# Patient Record
Sex: Female | Born: 1960
Health system: Southern US, Community
[De-identification: ages and names within clinical notes are randomized; demographics above are authoritative.]

## PROBLEM LIST (undated history)

## (undated) DIAGNOSIS — M549 Dorsalgia, unspecified: Secondary | ICD-10-CM

## (undated) DIAGNOSIS — I10 Essential (primary) hypertension: Secondary | ICD-10-CM

## (undated) DIAGNOSIS — R0602 Shortness of breath: Secondary | ICD-10-CM

## (undated) DIAGNOSIS — R5383 Other fatigue: Secondary | ICD-10-CM

## (undated) DIAGNOSIS — R7303 Prediabetes: Secondary | ICD-10-CM

## (undated) DIAGNOSIS — M1990 Unspecified osteoarthritis, unspecified site: Secondary | ICD-10-CM

## (undated) DIAGNOSIS — H539 Unspecified visual disturbance: Secondary | ICD-10-CM

## (undated) DIAGNOSIS — E785 Hyperlipidemia, unspecified: Secondary | ICD-10-CM

## (undated) DIAGNOSIS — M2550 Pain in unspecified joint: Secondary | ICD-10-CM

## (undated) HISTORY — DX: Dorsalgia, unspecified: M54.9

## (undated) HISTORY — DX: Hyperlipidemia, unspecified: E78.5

## (undated) HISTORY — DX: Other fatigue: R53.83

## (undated) HISTORY — DX: Unspecified visual disturbance: H53.9

## (undated) HISTORY — DX: Pain in unspecified joint: M25.50

## (undated) HISTORY — DX: Unspecified osteoarthritis, unspecified site: M19.90

## (undated) HISTORY — DX: Shortness of breath: R06.02

## (undated) HISTORY — PX: MULTIPLE TOOTH EXTRACTIONS: SHX2053

---

## 2001-07-28 ENCOUNTER — Encounter: Payer: Self-pay | Admitting: Emergency Medicine

## 2001-07-28 ENCOUNTER — Emergency Department (HOSPITAL_COMMUNITY): Admission: EM | Admit: 2001-07-28 | Discharge: 2001-07-28 | Payer: Self-pay | Admitting: Emergency Medicine

## 2003-06-05 ENCOUNTER — Emergency Department (HOSPITAL_COMMUNITY): Admission: EM | Admit: 2003-06-05 | Discharge: 2003-06-06 | Payer: Self-pay | Admitting: Emergency Medicine

## 2005-11-21 ENCOUNTER — Emergency Department (HOSPITAL_COMMUNITY): Admission: EM | Admit: 2005-11-21 | Discharge: 2005-11-22 | Payer: Self-pay | Admitting: Emergency Medicine

## 2007-09-08 ENCOUNTER — Emergency Department (HOSPITAL_COMMUNITY): Admission: EM | Admit: 2007-09-08 | Discharge: 2007-09-08 | Payer: Self-pay | Admitting: Emergency Medicine

## 2009-06-13 ENCOUNTER — Emergency Department (HOSPITAL_COMMUNITY): Admission: EM | Admit: 2009-06-13 | Discharge: 2009-06-13 | Payer: Self-pay | Admitting: Emergency Medicine

## 2009-12-04 ENCOUNTER — Encounter: Admission: RE | Admit: 2009-12-04 | Discharge: 2009-12-04 | Payer: Self-pay | Admitting: Specialist

## 2010-02-08 ENCOUNTER — Encounter: Admission: RE | Admit: 2010-02-08 | Discharge: 2010-02-08 | Payer: Self-pay | Admitting: Orthopedic Surgery

## 2011-07-18 ENCOUNTER — Emergency Department (HOSPITAL_COMMUNITY)
Admission: EM | Admit: 2011-07-18 | Discharge: 2011-07-19 | Disposition: A | Discharge status: Home / Self Care | Payer: BC Managed Care – PPO | Attending: Emergency Medicine | Admitting: Emergency Medicine

## 2011-07-18 DIAGNOSIS — M25473 Effusion, unspecified ankle: Secondary | ICD-10-CM | POA: Insufficient documentation

## 2011-07-18 DIAGNOSIS — L03116 Cellulitis of left lower limb: Secondary | ICD-10-CM

## 2011-07-18 DIAGNOSIS — L03119 Cellulitis of unspecified part of limb: Secondary | ICD-10-CM | POA: Insufficient documentation

## 2011-07-18 DIAGNOSIS — Z7982 Long term (current) use of aspirin: Secondary | ICD-10-CM | POA: Insufficient documentation

## 2011-07-18 DIAGNOSIS — M79609 Pain in unspecified limb: Secondary | ICD-10-CM | POA: Insufficient documentation

## 2011-07-18 DIAGNOSIS — L02419 Cutaneous abscess of limb, unspecified: Secondary | ICD-10-CM | POA: Insufficient documentation

## 2011-07-18 DIAGNOSIS — M7989 Other specified soft tissue disorders: Secondary | ICD-10-CM | POA: Insufficient documentation

## 2011-07-18 DIAGNOSIS — Z299 Encounter for prophylactic measures, unspecified: Secondary | ICD-10-CM

## 2011-07-18 DIAGNOSIS — Z79899 Other long term (current) drug therapy: Secondary | ICD-10-CM | POA: Insufficient documentation

## 2011-07-18 DIAGNOSIS — M25476 Effusion, unspecified foot: Secondary | ICD-10-CM | POA: Insufficient documentation

## 2011-07-18 HISTORY — DX: Essential (primary) hypertension: I10

## 2011-07-18 LAB — CBC
HCT: 35.5 % — ABNORMAL LOW (ref 36.0–46.0)
Hemoglobin: 11.4 g/dL — ABNORMAL LOW (ref 12.0–15.0)
MCH: 27.3 pg (ref 26.0–34.0)
MCHC: 32.1 g/dL (ref 30.0–36.0)
MCV: 85.1 fL (ref 78.0–100.0)
Platelets: 288 10*3/uL (ref 150–400)
RBC: 4.17 MIL/uL (ref 3.87–5.11)
RDW: 13 % (ref 11.5–15.5)
WBC: 8.8 10*3/uL (ref 4.0–10.5)

## 2011-07-18 LAB — POCT I-STAT, CHEM 8
BUN: 10 mg/dL (ref 6–23)
Calcium, Ion: 1.23 mmol/L (ref 1.12–1.32)
Chloride: 101 mEq/L (ref 96–112)
Creatinine, Ser: 0.8 mg/dL (ref 0.50–1.10)
Glucose, Bld: 100 mg/dL — ABNORMAL HIGH (ref 70–99)
HCT: 37 % (ref 36.0–46.0)
Hemoglobin: 12.6 g/dL (ref 12.0–15.0)
Potassium: 3.3 mEq/L — ABNORMAL LOW (ref 3.5–5.1)
Sodium: 142 mEq/L (ref 135–145)
TCO2: 31 mmol/L (ref 0–100)

## 2011-07-18 LAB — DIFFERENTIAL
Basophils Absolute: 0 10*3/uL (ref 0.0–0.1)
Basophils Relative: 0 % (ref 0–1)
Eosinophils Absolute: 0.2 10*3/uL (ref 0.0–0.7)
Eosinophils Relative: 2 % (ref 0–5)
Lymphocytes Relative: 27 % (ref 12–46)
Lymphs Abs: 2.4 10*3/uL (ref 0.7–4.0)
Monocytes Absolute: 0.7 10*3/uL (ref 0.1–1.0)
Monocytes Relative: 8 % (ref 3–12)
Neutro Abs: 5.5 10*3/uL (ref 1.7–7.7)
Neutrophils Relative %: 63 % (ref 43–77)

## 2011-07-18 LAB — D-DIMER, QUANTITATIVE: D-Dimer, Quant: 0.49 ug/mL-FEU — ABNORMAL HIGH (ref 0.00–0.48)

## 2011-07-18 NOTE — ED Notes (Signed)
Redness and warmth to LLE, palpable pulses

## 2011-07-18 NOTE — ED Notes (Signed)
Pt c/o LLE swelling x1 wk, pcp sent pt to ed to r/o DVT, no hx of DVT

## 2011-07-18 NOTE — ED Provider Notes (Signed)
History     CSN: [id number]  Arrival date & time 07/18/11  1825   First MD Initiated Contact with Patient 07/18/11 2153      No chief complaint on file.   (Consider location/radiation/quality/duration/timing/severity/associated sxs/prior treatment) HPI Comments: Ms. Bischoff is morbidly obese, African American female with one week of left lower mid shin anterior aspect pain and redness.  Moderate amount of swelling, which extends into the ankle [facility] and sent to the emergency room for further evaluation with the concern being DVT.  Patient does not have a history of DVT.  Other than obesity, has no breast factors to have a DVT  The history is provided by the patient.    Past Medical History  Diagnosis Date  . Hypertension     History reviewed. No pertinent past surgical history.  History reviewed. No pertinent family history.  History  Substance Use Topics  . Smoking status: Never Smoker   . Smokeless tobacco: Not on file  . Alcohol Use: No    OB History    Grav Para Term Preterm Abortions TAB SAB Ect Mult Living                  Review of Systems  Constitutional: Negative for fever, chills and activity change.  Cardiovascular: Positive for leg swelling.  Musculoskeletal: Negative for joint swelling and arthralgias.  Neurological: Negative for weakness.  Hematological: Negative.   Psychiatric/Behavioral: Negative.     Allergies  Review of patient's allergies indicates no known allergies.  Home Medications   Current Outpatient Rx  Name Route Sig Dispense Refill  . ASPIRIN 81 MG PO CHEW Oral Chew 81 mg by mouth daily.      Marland Kitchen LISINOPRIL-HYDROCHLOROTHIAZIDE 10-12.5 MG PO TABS Oral Take 1 tablet by mouth daily.      . CEPHALEXIN 250 MG PO CAPS Oral Take 1 capsule (250 mg total) by mouth 3 (three) times daily. 30 capsule 0    BP 123/65  Pulse 82  Temp(Src) 98.3 F (36.8 C) (Oral)  Resp 18  Ht 5\' 2"  (1.575 m)  Wt 308 lb (139.708 kg)  BMI  56.33 kg/m2  SpO2 98%  Physical Exam  Constitutional: She is oriented to person, place, and time. She appears well-developed and well-nourished.  HENT:  Head: Normocephalic.  Eyes: Pupils are equal, round, and reactive to light.  Neck: Normal range of motion.  Cardiovascular: Normal rate.   Pulmonary/Chest: Effort normal.  Musculoskeletal: She exhibits edema and tenderness.       Left ankle: She exhibits swelling. She exhibits no ecchymosis, no deformity, no laceration and normal pulse. tenderness.  Neurological: She is alert and oriented to person, place, and time.  Skin: Skin is intact. No bruising, no ecchymosis and no rash noted. There is erythema.     Psychiatric: She has a normal mood and affect.    ED Course  Procedures (including critical care time)  Labs Reviewed  CBC - Abnormal; Notable for the following:    Hemoglobin 11.4 (*)    HCT 35.5 (*)    All other components within normal limits  D-DIMER, QUANTITATIVE - Abnormal; Notable for the following:    D-Dimer, Quant 0.49 (*)    All other components within normal limits  POCT I-STAT, CHEM 8 - Abnormal; Notable for the following:    Potassium 3.3 (*)    Glucose, Bld 100 (*)    All other components within normal limits  DIFFERENTIAL  I-STAT, CHEM 8  No results found.   1. Cellulitis of left leg without foot   2. DVT prophylaxis       MDM  Facial cellulitis versus DVT.  We'll obtain labs including CBC, electrolytes, and d-dimer        Arman Filter, NP 07/18/11 2245  Arman Filter, NP 07/19/11 [phone]

## 2011-07-18 NOTE — ED Notes (Deleted)
Pt states her doctors office called while pt was waiting and said to just return in the morning.

## 2011-07-19 ENCOUNTER — Ambulatory Visit (HOSPITAL_COMMUNITY)
Admission: RE | Admit: 2011-07-19 | Discharge: 2011-07-19 | Disposition: A | Discharge status: Home / Self Care | Payer: BC Managed Care – PPO | Source: Ambulatory Visit | Attending: Emergency Medicine | Admitting: Emergency Medicine

## 2011-07-19 DIAGNOSIS — L0390 Cellulitis, unspecified: Secondary | ICD-10-CM

## 2011-07-19 DIAGNOSIS — L0291 Cutaneous abscess, unspecified: Secondary | ICD-10-CM | POA: Insufficient documentation

## 2011-07-19 DIAGNOSIS — M79609 Pain in unspecified limb: Secondary | ICD-10-CM

## 2011-07-19 MED ORDER — CEPHALEXIN 250 MG PO CAPS
250.0000 mg | ORAL_CAPSULE | Freq: Once | ORAL | Status: AC
  Administered 2011-07-19: 250 mg via ORAL
  Filled 2011-07-19: qty 1

## 2011-07-19 MED ORDER — ENOXAPARIN SODIUM 150 MG/ML ~~LOC~~ SOLN
1.0000 mg/kg | Freq: Once | SUBCUTANEOUS | Status: AC
  Administered 2011-07-19: 140 mg via SUBCUTANEOUS
  Filled 2011-07-19: qty 1

## 2011-07-19 MED ORDER — CEPHALEXIN 250 MG PO CAPS: 250.0000 mg | ORAL_CAPSULE | Freq: Three times a day (TID) | ORAL | Status: AC

## 2011-07-19 NOTE — ED Provider Notes (Signed)
Medical screening examination/treatment/procedure(s) were performed by non-physician practitioner and as supervising physician I was immediately available for consultation/collaboration.  Flint Melter, MD 07/19/11 [phone]

## 2011-07-19 NOTE — Progress Notes (Signed)
*  PRELIMINARY RESULTS*  Left Lower Extremity Venous Duplex has been performed. Technically difficult due to patient body habitus. Left:  No obvious evidence of DVT or Baker's cyst.   Farrel Demark RDMS 07/19/2011, 9:32 AM

## 2012-09-09 DIAGNOSIS — I10 Essential (primary) hypertension: Secondary | ICD-10-CM | POA: Insufficient documentation

## 2013-01-16 ENCOUNTER — Other Ambulatory Visit: Payer: Self-pay | Admitting: Family Medicine

## 2013-01-16 DIAGNOSIS — R109 Unspecified abdominal pain: Secondary | ICD-10-CM

## 2013-01-17 ENCOUNTER — Other Ambulatory Visit: Payer: BC Managed Care – PPO

## 2013-01-17 ENCOUNTER — Other Ambulatory Visit: Payer: Self-pay | Admitting: Family Medicine

## 2013-01-17 DIAGNOSIS — R109 Unspecified abdominal pain: Secondary | ICD-10-CM

## 2013-01-21 ENCOUNTER — Ambulatory Visit
Admission: RE | Admit: 2013-01-21 | Discharge: 2013-01-21 | Disposition: A | Discharge status: Home / Self Care | Payer: BC Managed Care – PPO | Source: Ambulatory Visit | Attending: Family Medicine | Admitting: Family Medicine

## 2013-01-21 DIAGNOSIS — R109 Unspecified abdominal pain: Secondary | ICD-10-CM

## 2013-01-30 ENCOUNTER — Other Ambulatory Visit: Payer: Self-pay | Admitting: Family Medicine

## 2013-01-30 DIAGNOSIS — K869 Disease of pancreas, unspecified: Secondary | ICD-10-CM

## 2013-02-06 ENCOUNTER — Ambulatory Visit
Admission: RE | Admit: 2013-02-06 | Discharge: 2013-02-06 | Disposition: A | Discharge status: Home / Self Care | Payer: BC Managed Care – PPO | Source: Ambulatory Visit | Attending: Family Medicine | Admitting: Family Medicine

## 2013-02-06 DIAGNOSIS — K869 Disease of pancreas, unspecified: Secondary | ICD-10-CM

## 2013-02-10 ENCOUNTER — Other Ambulatory Visit: Payer: BC Managed Care – PPO

## 2013-02-11 ENCOUNTER — Other Ambulatory Visit: Payer: Self-pay | Admitting: Obstetrics and Gynecology

## 2013-02-11 ENCOUNTER — Other Ambulatory Visit (HOSPITAL_COMMUNITY)
Admission: RE | Admit: 2013-02-11 | Discharge: 2013-02-11 | Disposition: A | Discharge status: Home / Self Care | Payer: BC Managed Care – PPO | Source: Ambulatory Visit | Attending: Obstetrics and Gynecology | Admitting: Obstetrics and Gynecology

## 2013-02-11 DIAGNOSIS — Z1151 Encounter for screening for human papillomavirus (HPV): Secondary | ICD-10-CM | POA: Insufficient documentation

## 2013-02-11 DIAGNOSIS — Z01419 Encounter for gynecological examination (general) (routine) without abnormal findings: Secondary | ICD-10-CM | POA: Insufficient documentation

## 2013-07-23 ENCOUNTER — Other Ambulatory Visit: Payer: Self-pay | Admitting: Family Medicine

## 2013-07-23 DIAGNOSIS — K862 Cyst of pancreas: Secondary | ICD-10-CM

## 2013-07-31 ENCOUNTER — Ambulatory Visit
Admission: RE | Admit: 2013-07-31 | Discharge: 2013-07-31 | Disposition: A | Discharge status: Home / Self Care | Payer: BC Managed Care – PPO | Source: Ambulatory Visit | Attending: Family Medicine | Admitting: Family Medicine

## 2013-07-31 DIAGNOSIS — K862 Cyst of pancreas: Secondary | ICD-10-CM

## 2014-03-23 IMAGING — US US ABDOMEN COMPLETE
1 series · 13 of 25 positions shown · non-contrast
Comparison: Ultrasound 01/21/2013

CLINICAL DATA: Followup pancreatic lesion.

EXAM:
ULTRASOUND ABDOMEN COMPLETE

[Series 1: us abdomen complete · 0.26mm/px · 13 of 95 slices shown]
[im 1/95]
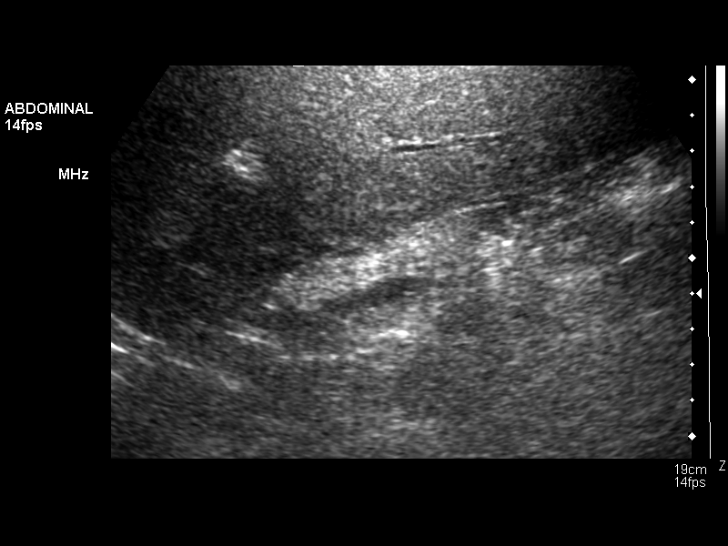
[im 8/95]
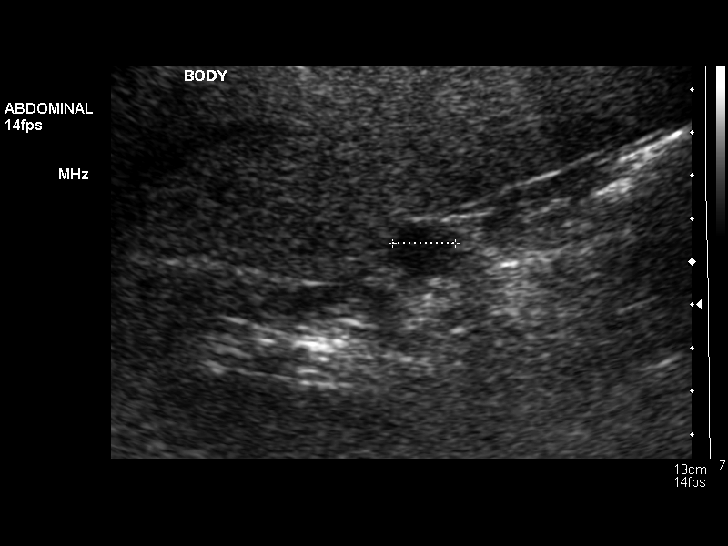
[im 16/95]
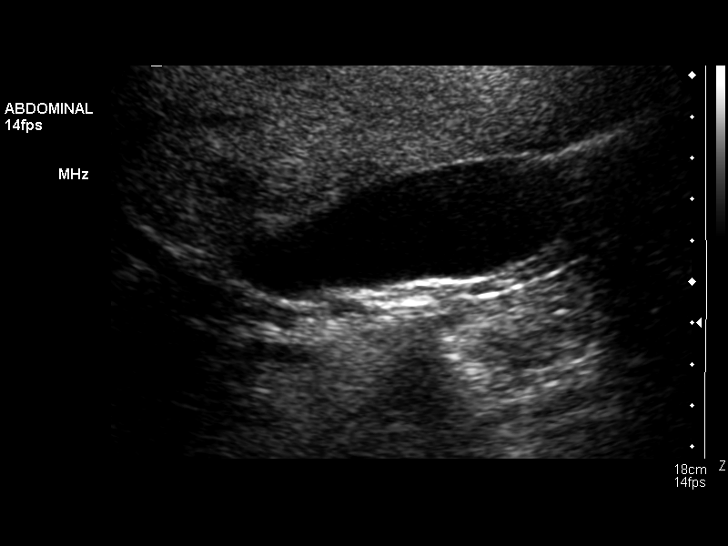
[im 24/95]
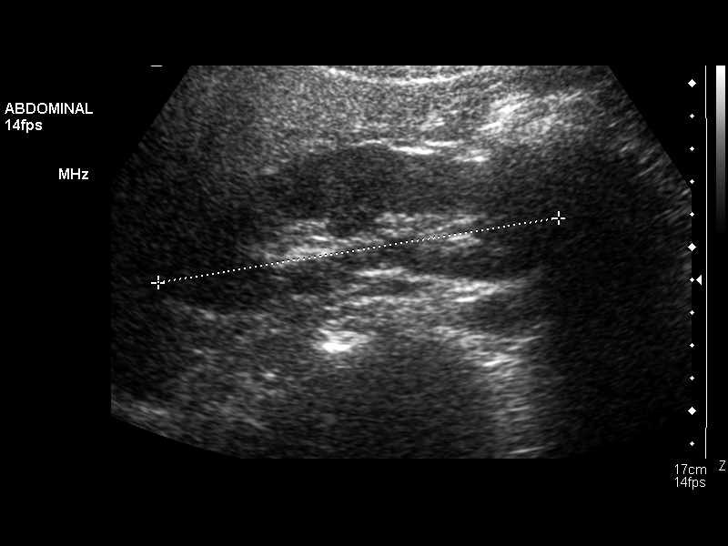
[im 32/95]
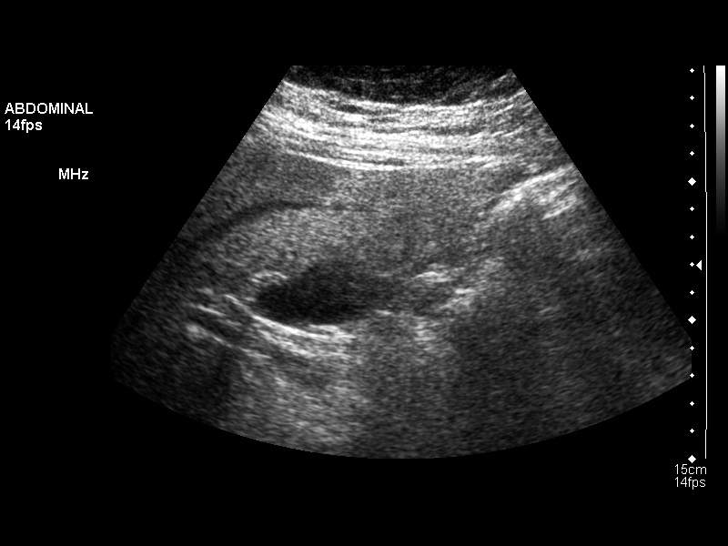
[im 40/95]
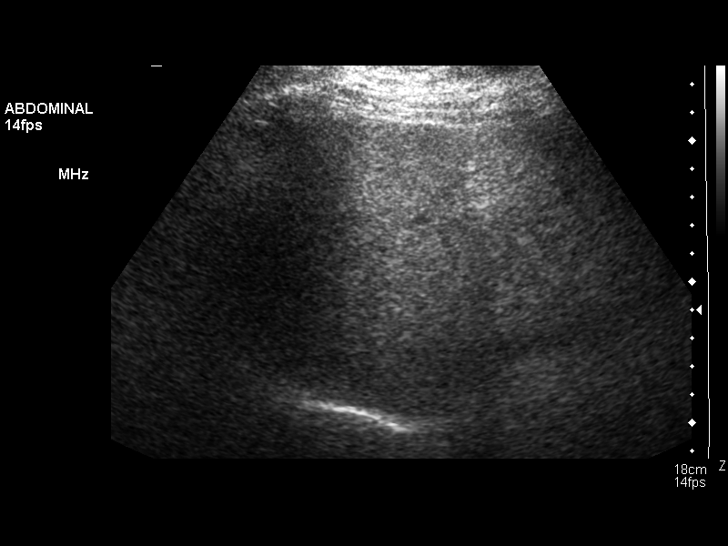
[im 48/95]
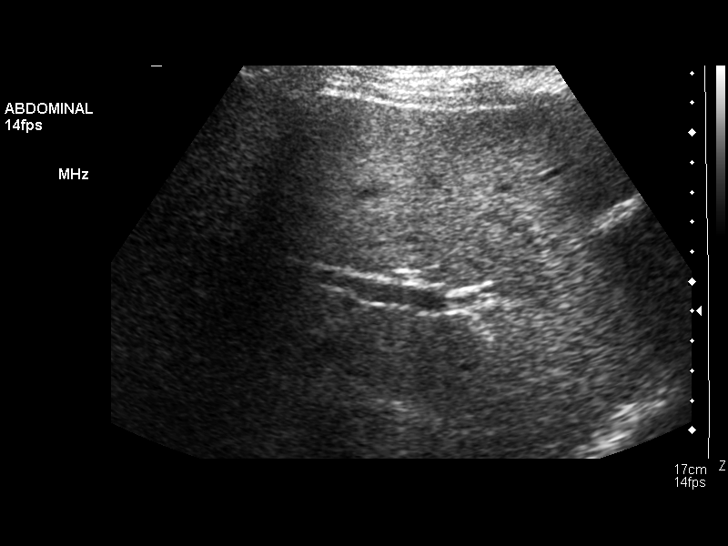
[im 55/95]
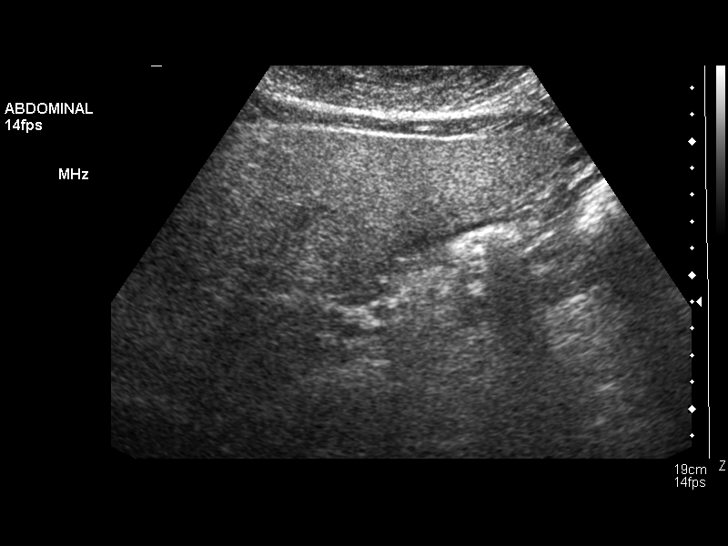
[im 63/95]
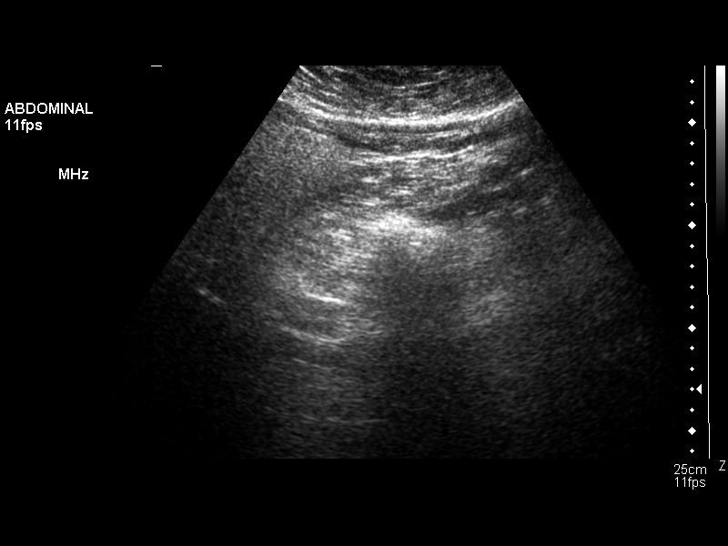
[im 71/95]
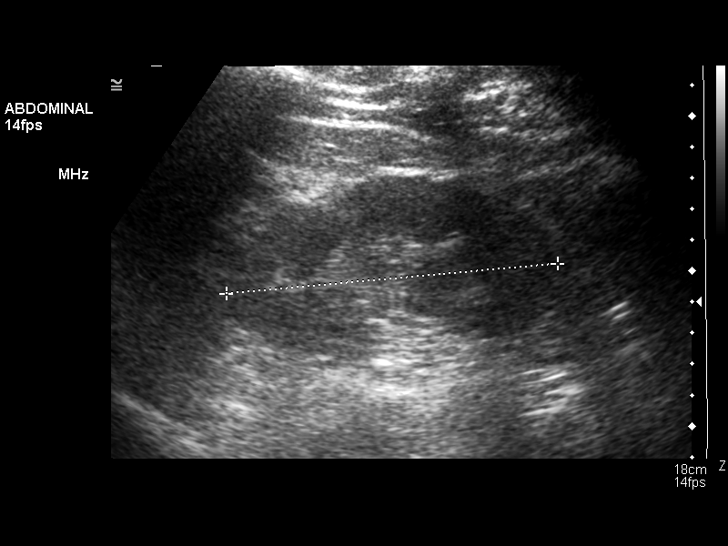
[im 79/95]
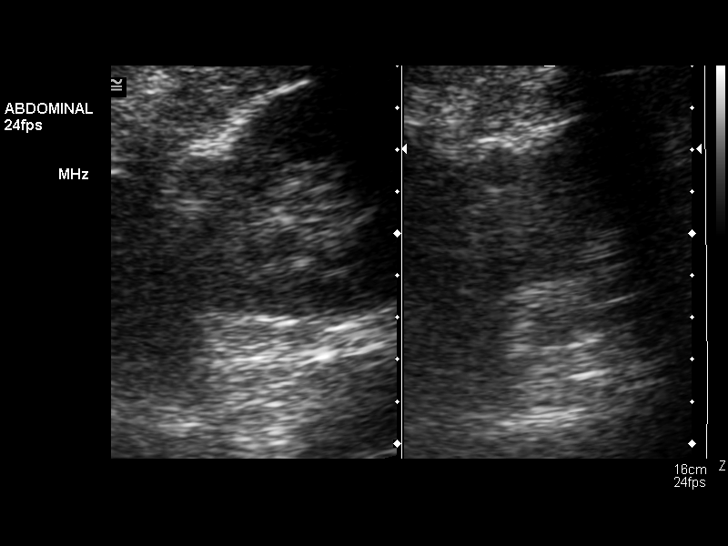
[im 87/95]
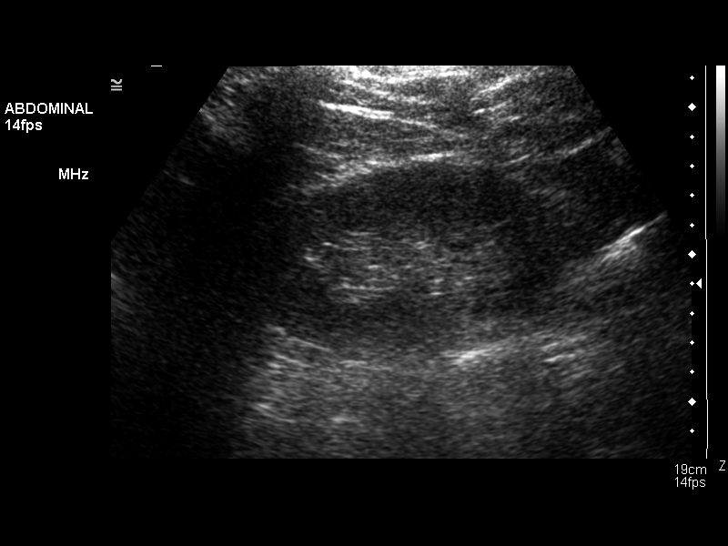
[im 95/95]
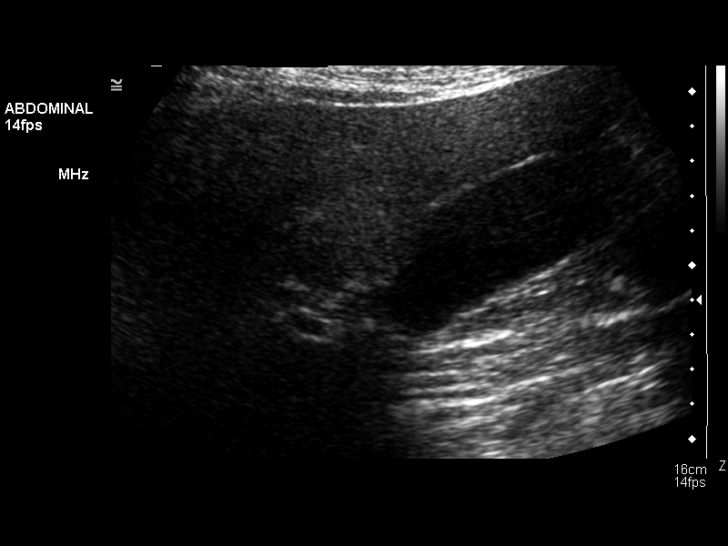

[13 of 25 positions shown; findings below may reference images not displayed]

FINDINGS: Gallbladder:

No gallstones or wall thickening visualized. No sonographic Murphy
sign noted.

Common bile duct:

Diameter: 6.4 mm

Liver:

Echogenic liver consistent with fatty infiltration. No focal liver
lesion.

IVC:

Not visualized

Pancreas:

Hypoechoic lesion in the pancreatic body measuring 18 x 13 x 15 mm.
No internal blood flow on Doppler evaluation. This lesion is
unchanged from the prior ultrasound.

Spleen:

Size and appearance within normal limits.

Right Kidney:

Length: 12.4 cm. Echogenicity within normal limits. No mass or
hydronephrosis visualized.

Left Kidney:

Length: 11.2 cm. 2.0 cm lower pole cyst and 2.3 cm midpole cyst..
Echogenicity within normal limits. Negative for hydronephrosis. Left
upper pole solid-appearing nodule measuring 11 mm without blood flow
by Doppler. Followup of this lesion is suggested. This is not seen
on prior studies. .

Abdominal aorta:

No aneurysm visualized.

Other findings:

Exam quality limited due to body habitus.
IMPRESSION: Hypoechoic lesion pancreatic body measuring 18 x 13 x 15 mm is
stable. This could be an inflammatory or neoplastic cyst.

11 mm left upper pole nodule. Left renal cyst. Follow-up of these
lesions suggested in 1 year.

## 2015-06-15 ENCOUNTER — Other Ambulatory Visit: Payer: Self-pay | Admitting: Family Medicine

## 2015-06-15 DIAGNOSIS — K862 Cyst of pancreas: Secondary | ICD-10-CM

## 2015-08-18 ENCOUNTER — Other Ambulatory Visit: Payer: Self-pay

## 2015-10-22 DIAGNOSIS — E669 Obesity, unspecified: Secondary | ICD-10-CM | POA: Diagnosis not present

## 2015-10-22 DIAGNOSIS — Z6841 Body Mass Index (BMI) 40.0 and over, adult: Secondary | ICD-10-CM | POA: Diagnosis not present

## 2015-10-22 DIAGNOSIS — M25561 Pain in right knee: Secondary | ICD-10-CM | POA: Diagnosis not present

## 2015-10-22 DIAGNOSIS — Z13220 Encounter for screening for lipoid disorders: Secondary | ICD-10-CM | POA: Diagnosis not present

## 2015-10-22 DIAGNOSIS — I10 Essential (primary) hypertension: Secondary | ICD-10-CM | POA: Diagnosis not present

## 2015-10-22 DIAGNOSIS — M25562 Pain in left knee: Secondary | ICD-10-CM | POA: Diagnosis not present

## 2016-04-28 DIAGNOSIS — I10 Essential (primary) hypertension: Secondary | ICD-10-CM | POA: Diagnosis not present

## 2016-04-28 DIAGNOSIS — E669 Obesity, unspecified: Secondary | ICD-10-CM | POA: Diagnosis not present

## 2016-04-28 DIAGNOSIS — M25561 Pain in right knee: Secondary | ICD-10-CM | POA: Diagnosis not present

## 2016-11-23 DIAGNOSIS — I10 Essential (primary) hypertension: Secondary | ICD-10-CM | POA: Diagnosis not present

## 2016-11-23 DIAGNOSIS — Z6841 Body Mass Index (BMI) 40.0 and over, adult: Secondary | ICD-10-CM | POA: Diagnosis not present

## 2017-03-15 DIAGNOSIS — R079 Chest pain, unspecified: Secondary | ICD-10-CM | POA: Diagnosis not present

## 2017-03-15 DIAGNOSIS — Z1159 Encounter for screening for other viral diseases: Secondary | ICD-10-CM | POA: Diagnosis not present

## 2017-03-15 DIAGNOSIS — E119 Type 2 diabetes mellitus without complications: Secondary | ICD-10-CM | POA: Diagnosis not present

## 2017-03-15 DIAGNOSIS — Z1389 Encounter for screening for other disorder: Secondary | ICD-10-CM | POA: Diagnosis not present

## 2017-03-15 DIAGNOSIS — Z6841 Body Mass Index (BMI) 40.0 and over, adult: Secondary | ICD-10-CM | POA: Diagnosis not present

## 2017-03-15 DIAGNOSIS — I10 Essential (primary) hypertension: Secondary | ICD-10-CM | POA: Diagnosis not present

## 2017-03-15 DIAGNOSIS — Z0001 Encounter for general adult medical examination with abnormal findings: Secondary | ICD-10-CM | POA: Diagnosis not present

## 2017-03-29 DIAGNOSIS — I10 Essential (primary) hypertension: Secondary | ICD-10-CM | POA: Diagnosis not present

## 2017-03-29 DIAGNOSIS — Z8249 Family history of ischemic heart disease and other diseases of the circulatory system: Secondary | ICD-10-CM | POA: Diagnosis not present

## 2017-03-29 DIAGNOSIS — R079 Chest pain, unspecified: Secondary | ICD-10-CM | POA: Diagnosis not present

## 2017-09-08 ENCOUNTER — Other Ambulatory Visit: Payer: Self-pay | Admitting: Obstetrics and Gynecology

## 2017-09-08 ENCOUNTER — Other Ambulatory Visit (HOSPITAL_COMMUNITY)
Admission: RE | Admit: 2017-09-08 | Discharge: 2017-09-08 | Disposition: A | Discharge status: Home / Self Care | Payer: Medicare Other | Source: Ambulatory Visit | Attending: Obstetrics and Gynecology | Admitting: Obstetrics and Gynecology

## 2017-09-08 DIAGNOSIS — Z124 Encounter for screening for malignant neoplasm of cervix: Secondary | ICD-10-CM | POA: Diagnosis not present

## 2017-09-08 DIAGNOSIS — N950 Postmenopausal bleeding: Secondary | ICD-10-CM | POA: Diagnosis not present

## 2017-09-11 ENCOUNTER — Other Ambulatory Visit: Payer: Self-pay | Admitting: Nurse Practitioner

## 2017-09-11 DIAGNOSIS — N950 Postmenopausal bleeding: Secondary | ICD-10-CM

## 2017-09-13 DIAGNOSIS — E119 Type 2 diabetes mellitus without complications: Secondary | ICD-10-CM | POA: Diagnosis not present

## 2017-09-13 DIAGNOSIS — Z6841 Body Mass Index (BMI) 40.0 and over, adult: Secondary | ICD-10-CM | POA: Diagnosis not present

## 2017-09-13 DIAGNOSIS — E669 Obesity, unspecified: Secondary | ICD-10-CM | POA: Diagnosis not present

## 2017-09-13 DIAGNOSIS — I10 Essential (primary) hypertension: Secondary | ICD-10-CM | POA: Diagnosis not present

## 2017-09-13 LAB — CYTOLOGY - PAP
Diagnosis: NEGATIVE
HPV: NOT DETECTED

## 2017-09-14 ENCOUNTER — Ambulatory Visit
Admission: RE | Admit: 2017-09-14 | Discharge: 2017-09-14 | Disposition: A | Discharge status: Home / Self Care | Payer: Medicare Other | Source: Ambulatory Visit | Attending: Nurse Practitioner | Admitting: Nurse Practitioner

## 2017-09-14 DIAGNOSIS — N939 Abnormal uterine and vaginal bleeding, unspecified: Secondary | ICD-10-CM | POA: Diagnosis not present

## 2017-09-14 DIAGNOSIS — N950 Postmenopausal bleeding: Secondary | ICD-10-CM

## 2017-09-19 ENCOUNTER — Other Ambulatory Visit: Payer: Self-pay | Admitting: Obstetrics and Gynecology

## 2017-09-19 DIAGNOSIS — N8500 Endometrial hyperplasia, unspecified: Secondary | ICD-10-CM | POA: Diagnosis not present

## 2017-09-19 DIAGNOSIS — N950 Postmenopausal bleeding: Secondary | ICD-10-CM | POA: Diagnosis not present

## 2017-09-27 ENCOUNTER — Encounter (HOSPITAL_COMMUNITY): Payer: Self-pay | Admitting: *Deleted

## 2017-09-27 ENCOUNTER — Other Ambulatory Visit: Payer: Self-pay

## 2017-09-27 NOTE — Progress Notes (Signed)
Surgery 10/02/17.  Late add-on.  No pre-op appointment available. Patient made LSD for DOS.  Patient to arrive at 0945 on 10/02/17 to make sure everything is complete and ready for 12 noon surgery.

## 2017-09-28 DIAGNOSIS — R9389 Abnormal findings on diagnostic imaging of other specified body structures: Secondary | ICD-10-CM | POA: Diagnosis not present

## 2017-09-28 DIAGNOSIS — N950 Postmenopausal bleeding: Secondary | ICD-10-CM | POA: Diagnosis not present

## 2017-09-28 DIAGNOSIS — Z6841 Body Mass Index (BMI) 40.0 and over, adult: Secondary | ICD-10-CM | POA: Diagnosis not present

## 2017-10-01 ENCOUNTER — Other Ambulatory Visit: Payer: Self-pay | Admitting: Obstetrics and Gynecology

## 2017-10-01 NOTE — H&P (Deleted)
  The note originally documented on this encounter has been moved the the encounter in which it belongs.  

## 2017-10-01 NOTE — H&P (Signed)
Chief Complaint(s):   postmenopausal bleeding.   HPI:  General 57 y/o presents for preop visit in preparation for hysteroscopy D&C possible polypecotmy with myosure. to evaluate postmenopausal bleeding. she reports spotting the christmas of 2017. she had spotting again the christmas of 2018. she began bleeding heavily a week ago. she change an overnight pad every 2 hours on her heaviest day. she was started on megace at her first visit 09/08/2017. Her bleeding has decreased.  she had an ultrasound 09/14/2017 that revealed a 12 cm x 5 cm x 8 cm uterus. the endometrium was thickened to 33 mm.  she and endometrial biopsy 09/19/2017 that showed benign secretory endometrium.  she reports menopause starting in her late 69's. Current Medication: Taking  Aspirin 325 MG Tablet 1 tablet Orally Once a day as needed     Fish Oil 1000 MG Capsule 1 capsule Orally Once a day     Vitamin D 1000 UNIT Tablet 1 tablet Orally Once a day     Megestrol Acetate 40 MG Tablet 1 tablet Orally Twice a day     Zestoretic(Lisinopril-Hydrochlorothiazide) 20-25 MG Tablet 1 tablet Orally Once a day     Medication List reviewed and reconciled with the patient   Medical History:  hypertension     tendenitis of knees Dr. Darrelyn Hillock     obesity     OA in bilateral knees & lower back      Allergies/Intolerance:  Latex Gloves - rash   Gyn History:  Sexual activity currently sexually active. Periods : postmenopausal. LMP 40's . Last pap smear date 09/08/17-NEG. Last mammogram date Never. Denies Abnormal pap smear. Denies STD. Menarche 13.   OB History:  Never been pregnant per patient.   Surgical History:  No Surgical History documented.   Hospitalization:  fluid on the knee 1979   Family History:  Father: deceased 29 yrs, massive MI, diagnosed with Coronary artery disease    Mother: deceased 46 yrs, brain blood clot post falling, hx of kidney failure, Diabetes, Hypertension    Paternal Grand Father: deceased, no  known hx    Paternal Grand Mother: deceased, no known hx    Maternal Grand Father: deceased, died in sleep    Maternal Grand Mother: deceased, old age, died in sleep    Brother 1: deceased 34 yrs, massive MI 09-06-18Hypertension, Coronary artery disease    Brother2: alive, healthy    Brother 3: alive, healthy    Sister 1: alive, HTN, Hypertension    Sister 2: alive, healthy    Sister 3: alive, HTN, Hypertension    3 brother(s) , 3 sister(s) - healthy.    no children.  Social History: General Tobacco use cigarettes: Never smoked, Tobacco history last updated 09/28/2017.  EXPOSURE TO PASSIVE SMOKE: no passive smoking.  no Alcohol, no.  Caffeine: yes, occasional tea.  no Recreational drug use.  DIET: regular.  Exercise: limited by knees.  DENTAL CARE: dentures.  Marital Status: married, Married, 1982, [place].  Children: none.  EDUCATION: High School.  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  COMMUNICATION BARRIERS: no hearing, vision or cognition issues.  Religion: [employer] [facility] [employer].  Seat belt use: always.  Tobacco Exposure: None.   ROS:  Negative except as stated in HPI.  Objective: Vitals: Wt 337, Wt change -3 lb, Ht 63, BMI 59.69, Pulse sitting 93, BP sitting 128/78  Past Results: Examination:  General Examination alert, oriented, NAD " label="GENERAL APPEARANCE" categoryPropId="[id number]" examid="[id number]"GENERAL APPEARANCE alert, oriented, NAD .  moist, warm" label="SKIN:" categoryPropId="10109" examid="[id number]"SKIN: moist, warm.  Conjunctiva clear" label="EYES:" categoryPropId="21468" examid="[id number]"EYES: Conjunctiva clear.  clear to auscultation bilaterally" label="LUNGS:" categoryPropId="87" examid="[id number]"LUNGS: clear to auscultation bilaterally.  regular rate and rhythm" label="HEART:" categoryPropId="86" examid="[id number]"HEART: regular rate and rhythm.  soft, non-tender/non-distended, bowel sounds present " label="ABDOMEN:"  categoryPropId="88" examid="[id number]"ABDOMEN: soft, non-tender/non-distended, bowel sounds present .  normal external genitalia, labia - unremarkable, vagina - pink moist mucosa, no lesions or abnormal discharge, cervix - no discharge or lesions or CMT, adnexa - no masses or tenderness, uterus - nontender and normal size on palpation " label="FEMALE GENITOURINARY:" categoryPropId="13414" examid="[id number]"FEMALE GENITOURINARY: normal external genitalia, labia - unremarkable, vagina - pink moist mucosa, no lesions or abnormal discharge, cervix - no discharge or lesions or CMT, adnexa - no masses or tenderness, uterus - nontender and normal size on palpation .  affect normal, good eye contact" label="PSYCH:" categoryPropId="16316" examid="[id number]"PSYCH: affect normal, good eye contact.  Physical Examination:    Assessment: Assessment:  Postmenopausal bleeding - N95.0 (Primary)     Endometrial thickening on ultra sound - 793.5     Morbid obesity - E66.01     Plan: Treatment: Postmenopausal bleeding Notes: given patient morbid obesity and severely thickened endometrium recommend hysteroscopy D&C possible polypecotmy with myosures even though her endometrial biopsy was benign. she is at very high risk for underlying hyperplasia or endometrial carcinoms.. r/b/a of hysteroscopy D&C and polypectomy with myosure were reviewed with the patient including but not limited to infection/ bleeding / perforation of the uterus/ damage to surrounding organs with the need for further surgery. she excepts these risk and desires to proceed.

## 2017-10-02 ENCOUNTER — Ambulatory Visit (HOSPITAL_COMMUNITY)
Admission: RE | Admit: 2017-10-02 | Discharge: 2017-10-02 | Disposition: A | Discharge status: Home / Self Care | Payer: Medicare Other | Source: Ambulatory Visit | Attending: Obstetrics and Gynecology | Admitting: Obstetrics and Gynecology

## 2017-10-02 ENCOUNTER — Ambulatory Visit (HOSPITAL_COMMUNITY): Payer: Medicare Other | Admitting: Anesthesiology

## 2017-10-02 ENCOUNTER — Other Ambulatory Visit: Payer: Self-pay

## 2017-10-02 ENCOUNTER — Encounter (HOSPITAL_COMMUNITY)
Admission: RE | Disposition: A | Discharge status: Home / Self Care | Payer: Self-pay | Source: Ambulatory Visit | Attending: Obstetrics and Gynecology

## 2017-10-02 ENCOUNTER — Encounter (HOSPITAL_COMMUNITY): Payer: Self-pay | Admitting: Anesthesiology

## 2017-10-02 DIAGNOSIS — N840 Polyp of corpus uteri: Secondary | ICD-10-CM | POA: Diagnosis not present

## 2017-10-02 DIAGNOSIS — M479 Spondylosis, unspecified: Secondary | ICD-10-CM | POA: Diagnosis not present

## 2017-10-02 DIAGNOSIS — Z79890 Hormone replacement therapy (postmenopausal): Secondary | ICD-10-CM | POA: Diagnosis not present

## 2017-10-02 DIAGNOSIS — Z91040 Latex allergy status: Secondary | ICD-10-CM | POA: Insufficient documentation

## 2017-10-02 DIAGNOSIS — Z6841 Body Mass Index (BMI) 40.0 and over, adult: Secondary | ICD-10-CM | POA: Insufficient documentation

## 2017-10-02 DIAGNOSIS — Z7982 Long term (current) use of aspirin: Secondary | ICD-10-CM | POA: Diagnosis not present

## 2017-10-02 DIAGNOSIS — Z79899 Other long term (current) drug therapy: Secondary | ICD-10-CM | POA: Diagnosis not present

## 2017-10-02 DIAGNOSIS — M170 Bilateral primary osteoarthritis of knee: Secondary | ICD-10-CM | POA: Diagnosis not present

## 2017-10-02 DIAGNOSIS — I10 Essential (primary) hypertension: Secondary | ICD-10-CM | POA: Insufficient documentation

## 2017-10-02 DIAGNOSIS — N950 Postmenopausal bleeding: Secondary | ICD-10-CM | POA: Insufficient documentation

## 2017-10-02 HISTORY — PX: DILATATION & CURETTAGE/HYSTEROSCOPY WITH MYOSURE: SHX6511

## 2017-10-02 HISTORY — DX: Prediabetes: R73.03

## 2017-10-02 HISTORY — DX: Unspecified osteoarthritis, unspecified site: M19.90

## 2017-10-02 LAB — CBC
HCT: 34.8 % — ABNORMAL LOW (ref 36.0–46.0)
Hemoglobin: 11.6 g/dL — ABNORMAL LOW (ref 12.0–15.0)
MCH: 28.4 pg (ref 26.0–34.0)
MCHC: 33.3 g/dL (ref 30.0–36.0)
MCV: 85.1 fL (ref 78.0–100.0)
Platelets: 315 10*3/uL (ref 150–400)
RBC: 4.09 MIL/uL (ref 3.87–5.11)
RDW: 14.3 % (ref 11.5–15.5)
WBC: 9.8 10*3/uL (ref 4.0–10.5)

## 2017-10-02 LAB — BASIC METABOLIC PANEL
Anion gap: 9 (ref 5–15)
BUN: 18 mg/dL (ref 6–20)
CO2: 20 mmol/L — ABNORMAL LOW (ref 22–32)
Calcium: 9.5 mg/dL (ref 8.9–10.3)
Chloride: 106 mmol/L (ref 101–111)
Creatinine, Ser: 1.11 mg/dL — ABNORMAL HIGH (ref 0.44–1.00)
GFR calc Af Amer: >60 mL/min (ref >60)
GFR calc non Af Amer: 54 mL/min — ABNORMAL LOW (ref >60)
Glucose, Bld: 118 mg/dL — ABNORMAL HIGH (ref 65–99)
Potassium: 4.2 mmol/L (ref 3.5–5.1)
Sodium: 135 mmol/L (ref 135–145)

## 2017-10-02 LAB — GLUCOSE, CAPILLARY: Glucose-Capillary: 106 mg/dL — ABNORMAL HIGH (ref 65–99)

## 2017-10-02 SURGERY — DILATATION & CURETTAGE/HYSTEROSCOPY WITH MYOSURE
Anesthesia: General | Site: Vagina

## 2017-10-02 MED ORDER — BUPIVACAINE HCL (PF) 0.25 % IJ SOLN
INTRAMUSCULAR | Status: DC | PRN
  Administered 2017-10-02: 20 mL

## 2017-10-02 MED ORDER — FENTANYL CITRATE (PF) 100 MCG/2ML IJ SOLN: 25.0000 ug | INTRAMUSCULAR | Status: DC | PRN

## 2017-10-02 MED ORDER — ONDANSETRON HCL 4 MG/2ML IJ SOLN
INTRAMUSCULAR | Status: AC
  Filled 2017-10-02: qty 2

## 2017-10-02 MED ORDER — PROPOFOL 10 MG/ML IV BOLUS
INTRAVENOUS | Status: DC | PRN
  Administered 2017-10-02: 200 mg via INTRAVENOUS
  Administered 2017-10-02 (×2): 50 mg via INTRAVENOUS
  Administered 2017-10-02: 60 mg via INTRAVENOUS
  Administered 2017-10-02: 50 mg via INTRAVENOUS

## 2017-10-02 MED ORDER — GLYCOPYRROLATE 0.2 MG/ML IJ SOLN
INTRAMUSCULAR | Status: AC
  Filled 2017-10-02: qty 1

## 2017-10-02 MED ORDER — LIDOCAINE HCL (CARDIAC) 20 MG/ML IV SOLN
INTRAVENOUS | Status: AC
  Filled 2017-10-02: qty 5

## 2017-10-02 MED ORDER — PHENYLEPHRINE 40 MCG/ML (10ML) SYRINGE FOR IV PUSH (FOR BLOOD PRESSURE SUPPORT)
PREFILLED_SYRINGE | INTRAVENOUS | Status: AC
  Filled 2017-10-02: qty 10

## 2017-10-02 MED ORDER — IBUPROFEN 800 MG PO TABS: 800.0000 mg | ORAL_TABLET | Freq: Three times a day (TID) | ORAL | 0 refills | Status: DC | PRN

## 2017-10-02 MED ORDER — HYDROCODONE-ACETAMINOPHEN 7.5-325 MG PO TABS: 1.0000 | ORAL_TABLET | Freq: Once | ORAL | Status: DC | PRN

## 2017-10-02 MED ORDER — PROPOFOL 10 MG/ML IV BOLUS
INTRAVENOUS | Status: AC
  Filled 2017-10-02: qty 40

## 2017-10-02 MED ORDER — SODIUM CHLORIDE 0.9 % IR SOLN
Status: DC | PRN
  Administered 2017-10-02: 3000 mL

## 2017-10-02 MED ORDER — SCOPOLAMINE 1 MG/3DAYS TD PT72
MEDICATED_PATCH | TRANSDERMAL | Status: AC
  Administered 2017-10-02: 1.5 mg
  Filled 2017-10-02: qty 1

## 2017-10-02 MED ORDER — BUPIVACAINE HCL (PF) 0.25 % IJ SOLN
INTRAMUSCULAR | Status: AC
  Filled 2017-10-02: qty 30

## 2017-10-02 MED ORDER — PHENYLEPHRINE HCL 10 MG/ML IJ SOLN
INTRAMUSCULAR | Status: DC | PRN
  Administered 2017-10-02 (×7): 80 ug via INTRAVENOUS

## 2017-10-02 MED ORDER — METOCLOPRAMIDE HCL 5 MG/ML IJ SOLN: 10.0000 mg | Freq: Once | INTRAMUSCULAR | Status: DC | PRN

## 2017-10-02 MED ORDER — LIDOCAINE HCL (CARDIAC) 20 MG/ML IV SOLN
INTRAVENOUS | Status: DC | PRN
  Administered 2017-10-02: 100 mg via INTRAVENOUS

## 2017-10-02 MED ORDER — SUCCINYLCHOLINE CHLORIDE 20 MG/ML IJ SOLN
INTRAMUSCULAR | Status: DC | PRN
  Administered 2017-10-02: 120 mg via INTRAVENOUS

## 2017-10-02 MED ORDER — LACTATED RINGERS IV SOLN
INTRAVENOUS | Status: DC
  Administered 2017-10-02 (×2): via INTRAVENOUS

## 2017-10-02 MED ORDER — MIDAZOLAM HCL 2 MG/2ML IJ SOLN
INTRAMUSCULAR | Status: AC
  Filled 2017-10-02: qty 2

## 2017-10-02 MED ORDER — ONDANSETRON HCL 4 MG/2ML IJ SOLN
INTRAMUSCULAR | Status: DC | PRN
  Administered 2017-10-02: 4 mg via INTRAVENOUS

## 2017-10-02 MED ORDER — FENTANYL CITRATE (PF) 100 MCG/2ML IJ SOLN
INTRAMUSCULAR | Status: AC
  Filled 2017-10-02: qty 2

## 2017-10-02 MED ORDER — FENTANYL CITRATE (PF) 100 MCG/2ML IJ SOLN
INTRAMUSCULAR | Status: DC | PRN
  Administered 2017-10-02: 100 ug via INTRAVENOUS

## 2017-10-02 MED ORDER — FENTANYL CITRATE (PF) 250 MCG/5ML IJ SOLN
INTRAMUSCULAR | Status: AC
  Filled 2017-10-02: qty 5

## 2017-10-02 MED ORDER — GLYCOPYRROLATE 0.2 MG/ML IJ SOLN
INTRAMUSCULAR | Status: DC | PRN
  Administered 2017-10-02: 0.1 mg via INTRAVENOUS

## 2017-10-02 MED ORDER — PROPOFOL 10 MG/ML IV BOLUS
INTRAVENOUS | Status: AC
  Filled 2017-10-02: qty 20

## 2017-10-02 MED ORDER — MEPERIDINE HCL 25 MG/ML IJ SOLN: 6.2500 mg | INTRAMUSCULAR | Status: DC | PRN

## 2017-10-02 MED ORDER — MIDAZOLAM HCL 5 MG/5ML IJ SOLN
INTRAMUSCULAR | Status: DC | PRN
  Administered 2017-10-02: 1 mg via INTRAVENOUS

## 2017-10-02 MED ORDER — SUCCINYLCHOLINE CHLORIDE 200 MG/10ML IV SOSY
PREFILLED_SYRINGE | INTRAVENOUS | Status: AC
  Filled 2017-10-02: qty 10

## 2017-10-02 SURGICAL SUPPLY — 17 items
CANISTER SUCT 3000ML PPV (MISCELLANEOUS) ×2 IMPLANT
CATH ROBINSON RED A/P 16FR (CATHETERS) ×2 IMPLANT
DEVICE MYOSURE LITE (MISCELLANEOUS) IMPLANT
DEVICE MYOSURE REACH (MISCELLANEOUS) ×1 IMPLANT
FILTER ARTHROSCOPY CONVERTOR (FILTER) ×2 IMPLANT
GLOVE BIOGEL M 6.5 STRL (GLOVE) ×4 IMPLANT
GLOVE BIOGEL PI IND STRL 6.5 (GLOVE) ×1 IMPLANT
GLOVE BIOGEL PI IND STRL 7.0 (GLOVE) ×1 IMPLANT
GLOVE BIOGEL PI INDICATOR 6.5 (GLOVE) ×1
GLOVE BIOGEL PI INDICATOR 7.0 (GLOVE) ×1
GOWN STRL REUS W/TWL LRG LVL3 (GOWN DISPOSABLE) ×4 IMPLANT
PACK VAGINAL MINOR WOMEN LF (CUSTOM PROCEDURE TRAY) ×2 IMPLANT
PAD OB MATERNITY 4.3X12.25 (PERSONAL CARE ITEMS) ×2 IMPLANT
SEAL ROD LENS SCOPE MYOSURE (ABLATOR) ×2 IMPLANT
TOWEL OR 17X24 6PK STRL BLUE (TOWEL DISPOSABLE) ×4 IMPLANT
TUBING AQUILEX INFLOW (TUBING) ×2 IMPLANT
TUBING AQUILEX OUTFLOW (TUBING) ×2 IMPLANT

## 2017-10-02 NOTE — Anesthesia Procedure Notes (Signed)
Procedure Name: Intubation Date/Time: 10/02/2017 12:15 PM Performed by: Riki Sheer, CRNA Pre-anesthesia Checklist: Patient identified, Emergency Drugs available, Suction available, Patient being monitored and Timeout performed Patient Re-evaluated:Patient Re-evaluated prior to induction Oxygen Delivery Method: Circle system utilized Preoxygenation: Pre-oxygenation with 100% oxygen Induction Type: IV induction Ventilation: Two handed mask ventilation required and Oral airway inserted - appropriate to patient size Laryngoscope Size: Mac and 3 Grade View: Grade I Tube type: Oral Tube size: 7.0 mm Number of attempts: 1 Airway Equipment and Method: Patient positioned with wedge pillow and Stylet Placement Confirmation: ETT inserted through vocal cords under direct vision,  positive ETCO2,  CO2 detector and breath sounds checked- equal and bilateral Secured at: 21 cm Tube secured with: Tape Dental Injury: Teeth and Oropharynx as per pre-operative assessment

## 2017-10-02 NOTE — Op Note (Signed)
10/02/2017  1:30 PM  PATIENT:  Michelle Pearson  57 y.o. female  PRE-OPERATIVE DIAGNOSIS:  N95.0 postmenopausal bleeding.  POST-OPERATIVE DIAGNOSIS:  N95.0 Post menopausal bleeding  PROCEDURE:  Procedure(s) with comments: DILATATION & CURETTAGE/HYSTEROSCOPY WITH MYOSURE (N/A) - possible polypectomy  SURGEON:  Surgeon(s) and Role:    Gerald Leitz* Asanti Craigo, MD - Primary  PHYSICIAN ASSISTANT: None  ASSISTANTS: none   ANESTHESIA:   general  EBL:  25 mL  Deficit: 400 cc of normal saline   BLOOD ADMINISTERED:none  DRAINS: none   LOCAL MEDICATIONS USED:  MARCAINE     SPECIMEN:  Source of Specimen:  endometrial currettings and endometrial polyps   DISPOSITION OF SPECIMEN:  PATHOLOGY  COUNTS:  YES  TOURNIQUET:  * No tourniquets in log *  DICTATION: .Dragon Dictation  PLAN OF CARE: Discharge to home after PACU  PATIENT DISPOSITION:  PACU - hemodynamically stable.   Delay start of Pharmacological VTE agent (>24hrs) due to surgical blood loss or risk of bleeding: not applicable  Findings: Normal external genitalia. Normal cervix.. Small amount of blood in the vaginal vault... 2 endometrial  Polyps..   Procedure: Patient was taken to the operating room where she was placed under general anesthesia. She was placed in the dorsal lithotomy position. She was prepped and draped in the usual sterile fashion. A speculum was placed into the vaginal vault. The anterior lip of the cervix was grasped with a single-tooth tenaculum. Quarter percent Marcaine was injected at the 4 and 8:00 positions of the cervix. The cervix was then sounded to 8 cm. The cervix was dilated to approximately 6 mm. Diagnostic hysteroscope was inserted. The findings noted above. The myosure Reach blade was placed through the hysteroscope and the polyps were removed without difficulty.  The hysteroscope was removed. Sharp curet was introduced and endometrial corrected curettings were obtained. The hysteroscope was then  reinserted. There was no evidence of endometrial polyps or masses with reinsertion of the hysteroscope. There was no evidence of perforation. Hysteroscope was then removed. The single-tooth tenaculum was removed from the anterior lip of the cervix.  The speculum was removed from the patient's vagina. She was awakened from anesthesia taken care at country room awake and in stable condition. Sponge lap and needle counts were correct x2.

## 2017-10-02 NOTE — Anesthesia Postprocedure Evaluation (Signed)
Anesthesia Post Note  Patient: Michelle Pearson  Procedure(s) Performed: DILATATION & CURETTAGE/HYSTEROSCOPY WITH MYOSURE (N/A Vagina )     Patient location during evaluation: PACU Anesthesia Type: General Level of consciousness: awake and alert and oriented Pain management: pain level controlled Vital Signs Assessment: post-procedure vital signs reviewed and stable Respiratory status: spontaneous breathing, nonlabored ventilation and respiratory function stable Cardiovascular status: blood pressure returned to baseline and stable Postop Assessment: no apparent nausea or vomiting Anesthetic complications: no    Last Vitals:  Vitals:   10/02/17 1345 10/02/17 1409  BP: 127/70   Pulse: 85 90  Resp: 18 18  Temp:    SpO2: 100% 99%    Last Pain:  Vitals:   10/02/17 1409  TempSrc:   PainSc: 0-No pain   Pain Goal: Patients Stated Pain Goal: 3 (10/02/17 1012)               Perlie Scheuring A.

## 2017-10-02 NOTE — H&P (Signed)
Date of Initial H&P:10/01/2017  History reviewed, patient examined, no change in status, stable for surgery.

## 2017-10-02 NOTE — Discharge Instructions (Signed)
DISCHARGE INSTRUCTIONS: HYSTEROSCOPY  The following instructions have been prepared to help you care for yourself upon your return home.  May Remove Scop patch on or before 10/05/17   Personal hygiene:  Use sanitary pads for vaginal drainage, not tampons.  Shower the day after your procedure.  NO tub baths, pools or Jacuzzis for 2-3 weeks.  Wipe front to back after using the bathroom.  Activity and limitations:  Do NOT drive or operate any equipment for 24 hours. The effects of anesthesia are still present and drowsiness may result.  Do NOT rest in bed all day.  Walking is encouraged.  Walk up and down stairs slowly.  You may resume your normal activity in one to two days or as indicated by your physician. Sexual activity: NO intercourse for at least 2 weeks after the procedure, or as indicated by your Doctor.  Diet: Eat a light meal as desired this evening. You may resume your usual diet tomorrow.  Return to Work: You may resume your work activities in one to two days or as indicated by Therapist, sportsyour Doctor.  What to expect after your surgery: Expect to have vaginal bleeding/discharge for 2-3 days and spotting for up to 10 days. It is not unusual to have soreness for up to 1-2 weeks. You may have a slight burning sensation when you urinate for the first day. Mild cramps may continue for a couple of days. You may have a regular period in 2-6 weeks.  Call your doctor for any of the following:  Excessive vaginal bleeding or clotting, saturating and changing one pad every hour.  Inability to urinate 6 hours after discharge from hospital.  Pain not relieved by pain medication.  Fever of 100.4 F or greater.  Unusual vaginal discharge or odor.  Return to office _________________Call for an appointment ___________________ Patients signature: ______________________ Nurses signature ________________________  Post Anesthesia Care Unit [phone]    Post Anesthesia Home  Care Instructions  Activity: Get plenty of rest for the remainder of the day. A responsible individual must stay with you for 24 hours following the procedure.  For the next 24 hours, DO NOT: -Drive a car -Advertising copywriterperate machinery -Drink alcoholic beverages -Take any medication unless instructed by your physician -Make any legal decisions or sign important papers.  Meals: Start with liquid foods such as gelatin or soup. Progress to regular foods as tolerated. Avoid greasy, spicy, heavy foods. If nausea and/or vomiting occur, drink only clear liquids until the nausea and/or vomiting subsides. Call your physician if vomiting continues.  Special Instructions/Symptoms: Your throat may feel dry or sore from the anesthesia or the breathing tube placed in your throat during surgery. If this causes discomfort, gargle with warm salt water. The discomfort should disappear within 24 hours.  If you had a scopolamine patch placed behind your ear for the management of post- operative nausea and/or vomiting:  1. The medication in the patch is effective for 72 hours, after which it should be removed.  Wrap patch in a tissue and discard in the trash. Wash hands thoroughly with soap and water. 2. You may remove the patch earlier than 72 hours if you experience unpleasant side effects which may include dry mouth, dizziness or visual disturbances. 3. Avoid touching the patch. Wash your hands with soap and water after contact with the patch.

## 2017-10-02 NOTE — Transfer of Care (Signed)
Immediate Anesthesia Transfer of Care Note  Patient: Michelle OgrenJanine H Pearson  Procedure(s) Performed: DILATATION & CURETTAGE/HYSTEROSCOPY WITH MYOSURE (N/A Vagina )  Patient Location: PACU  Anesthesia Type:General  Level of Consciousness: sedated  Airway & Oxygen Therapy: Patient Spontanous Breathing and Patient connected to face mask oxygen  Post-op Assessment: Report given to RN  Post vital signs: Reviewed and stable  Last Vitals:  Vitals Value Taken Time  BP 127/70 10/02/2017  1:45 PM  Temp    Pulse 89 10/02/2017  1:57 PM  Resp 21 10/02/2017  1:57 PM  SpO2 100 % 10/02/2017  1:57 PM  Vitals shown include unvalidated device data.  Last Pain:  Vitals:   10/02/17 1012  TempSrc: Oral      Patients Stated Pain Goal: 3 (10/02/17 1012)  Complications: No apparent anesthesia complications

## 2017-10-02 NOTE — Anesthesia Preprocedure Evaluation (Addendum)
Anesthesia Evaluation  Patient identified by MRN, date of birth, ID band Patient awake    Reviewed: Allergy & Precautions, NPO status , Patient's Chart, lab work & pertinent test results  Airway Mallampati: III  TM Distance: >3 FB Neck ROM: Full    Dental  (+) Edentulous Upper, Edentulous Lower   Pulmonary neg pulmonary ROS,    Pulmonary exam normal breath sounds clear to auscultation       Cardiovascular hypertension, Pt. on medications Normal cardiovascular exam Rhythm:Regular Rate:Normal     Neuro/Psych negative neurological ROS  negative psych ROS   GI/Hepatic negative GI ROS, Neg liver ROS,   Endo/Other  Morbid obesityPre diabetes  Renal/GU Renal InsufficiencyRenal disease  negative genitourinary   Musculoskeletal  (+) Arthritis , Osteoarthritis,    Abdominal (+) + obese,   Peds  Hematology   Anesthesia Other Findings   Reproductive/Obstetrics PMB                           Anesthesia Physical Anesthesia Plan  ASA: III  Anesthesia Plan: General   Post-op Pain Management:    Induction: Intravenous  PONV Risk Score and Plan: 4 or greater and Scopolamine patch - Pre-op, Midazolam, Dexamethasone, Ondansetron and Treatment may vary due to age or medical condition  Airway Management Planned: LMA and Oral ETT  Additional Equipment:   Intra-op Plan:   Post-operative Plan: Extubation in OR  Informed Consent: I have reviewed the patients History and Physical, chart, labs and discussed the procedure including the risks, benefits and alternatives for the proposed anesthesia with the patient or authorized representative who has indicated his/her understanding and acceptance.   Dental advisory given  Plan Discussed with: CRNA, Anesthesiologist and Surgeon  Anesthesia Plan Comments:         Anesthesia Quick Evaluation

## 2017-10-03 ENCOUNTER — Encounter (HOSPITAL_COMMUNITY): Payer: Self-pay | Admitting: Obstetrics and Gynecology

## 2018-03-28 DIAGNOSIS — Z0000 Encounter for general adult medical examination without abnormal findings: Secondary | ICD-10-CM | POA: Diagnosis not present

## 2018-03-28 DIAGNOSIS — Z1389 Encounter for screening for other disorder: Secondary | ICD-10-CM | POA: Diagnosis not present

## 2018-03-28 DIAGNOSIS — Z1211 Encounter for screening for malignant neoplasm of colon: Secondary | ICD-10-CM | POA: Diagnosis not present

## 2018-03-28 DIAGNOSIS — I10 Essential (primary) hypertension: Secondary | ICD-10-CM | POA: Diagnosis not present

## 2018-03-28 DIAGNOSIS — Z6841 Body Mass Index (BMI) 40.0 and over, adult: Secondary | ICD-10-CM | POA: Diagnosis not present

## 2018-03-28 DIAGNOSIS — E669 Obesity, unspecified: Secondary | ICD-10-CM | POA: Diagnosis not present

## 2018-03-28 DIAGNOSIS — E1169 Type 2 diabetes mellitus with other specified complication: Secondary | ICD-10-CM | POA: Diagnosis not present

## 2018-12-28 IMAGING — US US PELVIS COMPLETE TRANSABD/TRANSVAG
1 series · 13 of 25 positions shown · non-contrast
Comparison: None

CLINICAL DATA: Patient with vaginal bleeding for 2 years. Patient
is postmenopausal.



[Series 1: us pelvis complete transabd/transvag · 0.30mm/px · 48 acquisitions, 13 frames shown]
[im 1/48]
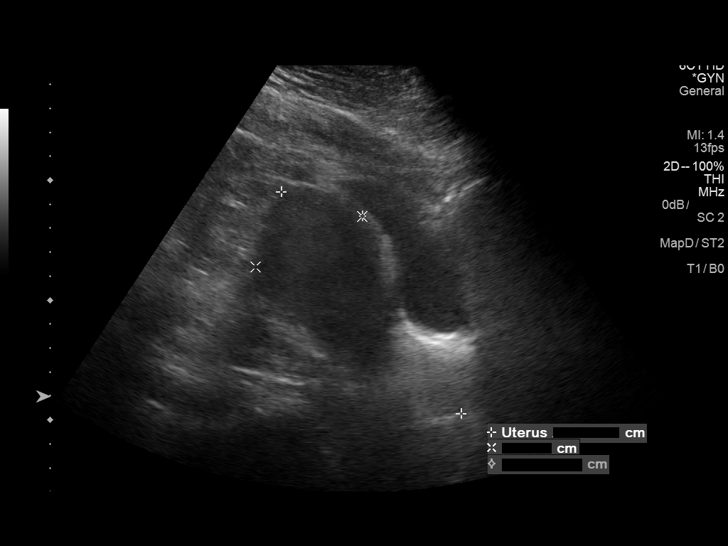
[im 4/48]
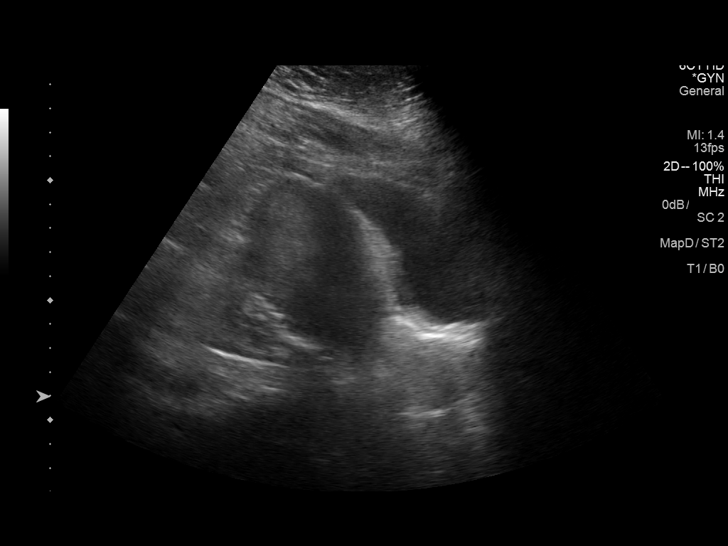
[im 8/48]
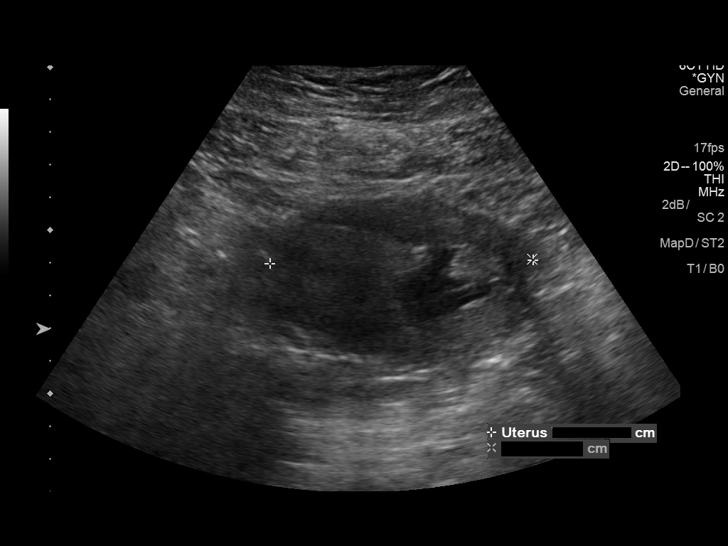
[im 12/48]
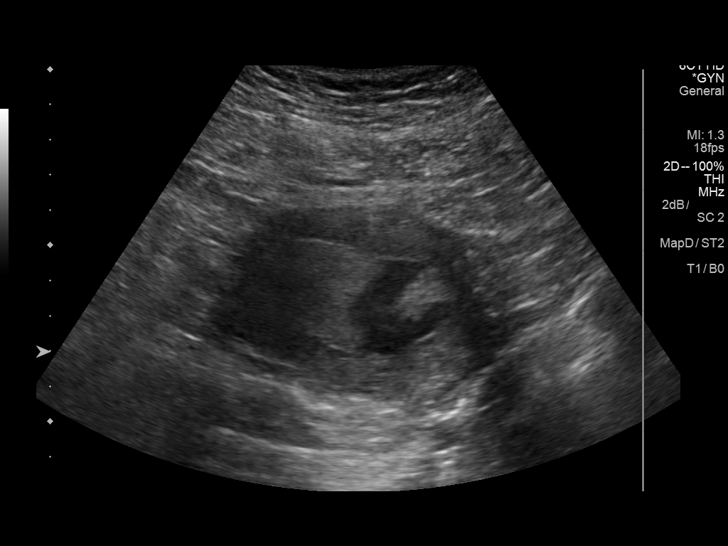
[im 16/48]
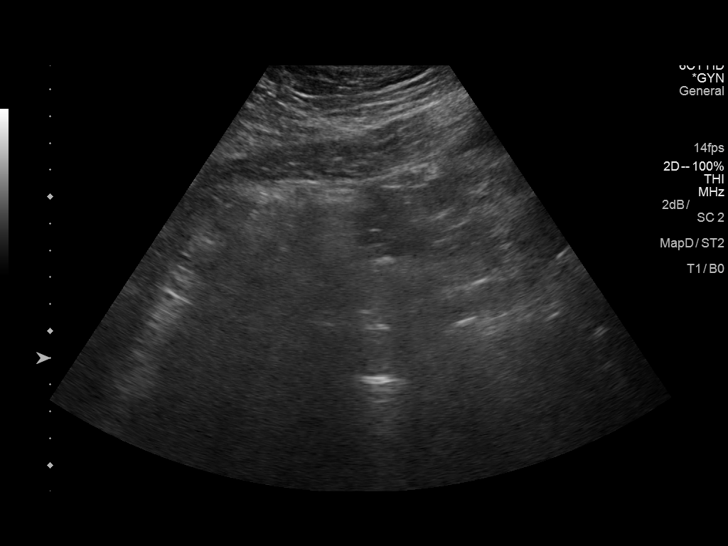
[im 20/48]
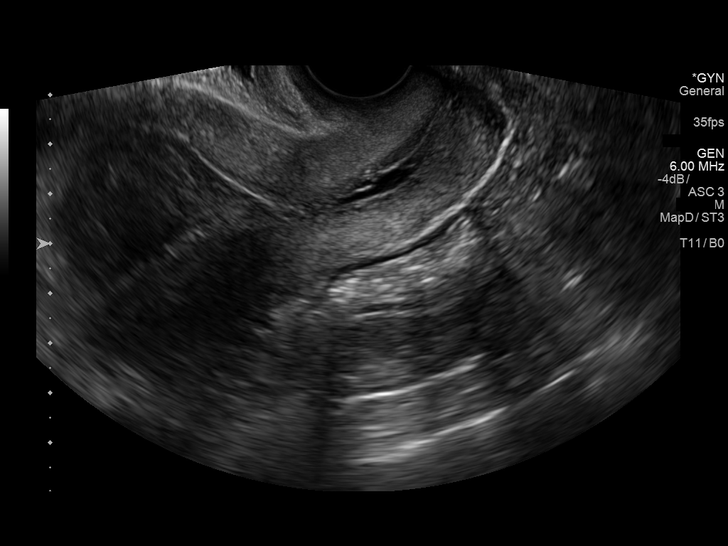
[im 24/48]
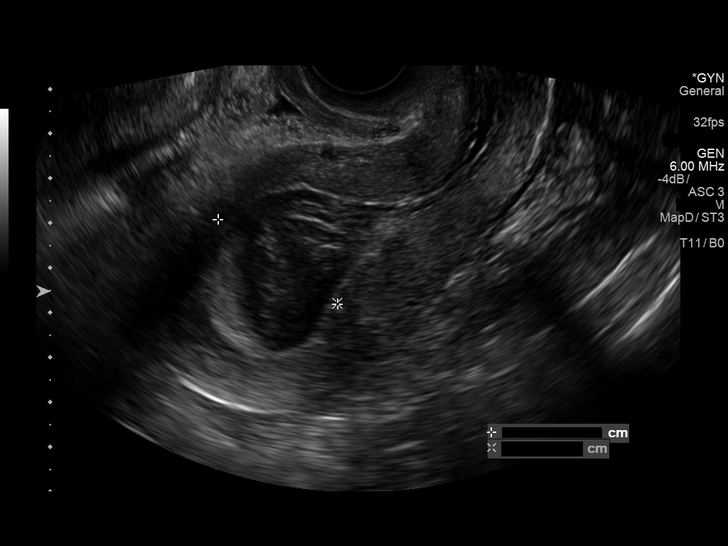
[im 28/48]
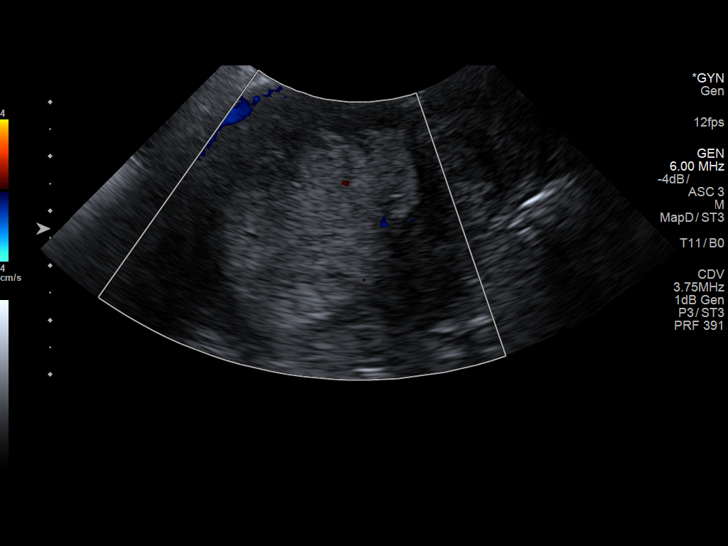
[im 32/48]
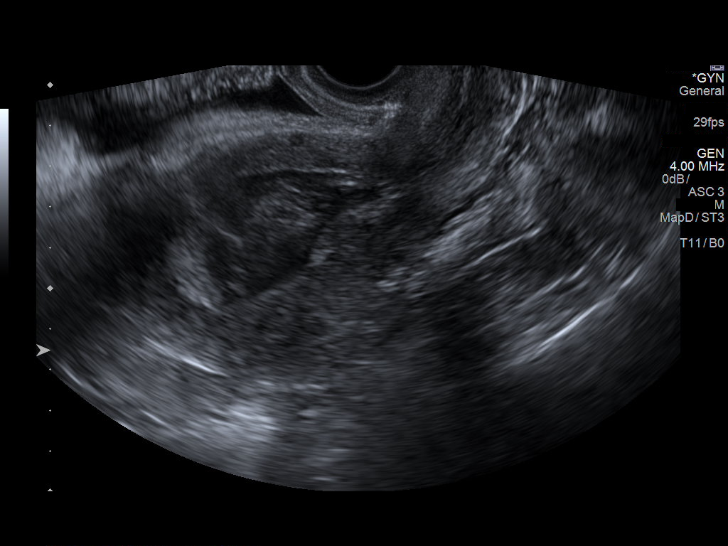
[im 36/48]
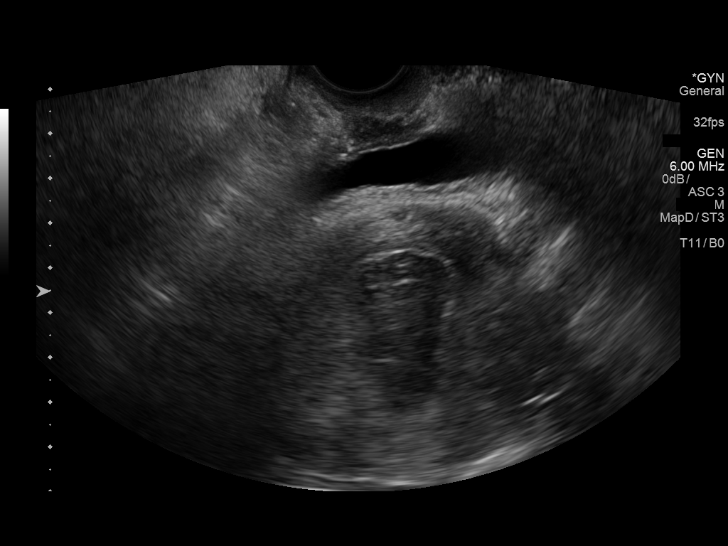
[im 40/48]
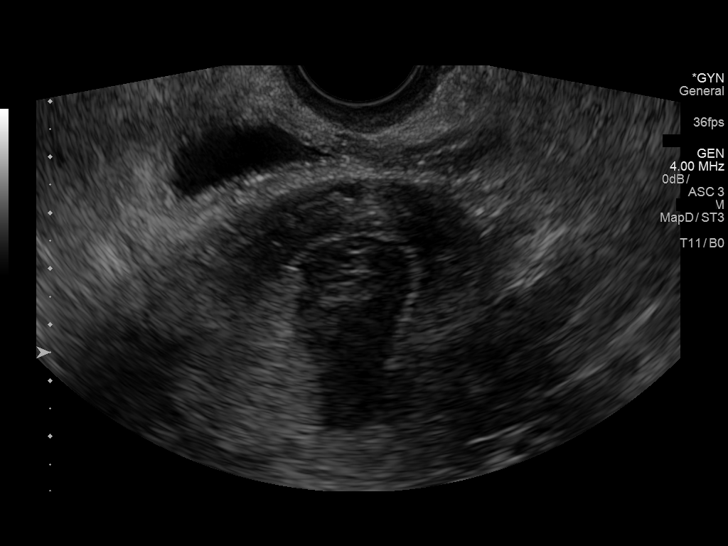
[im 44/48]
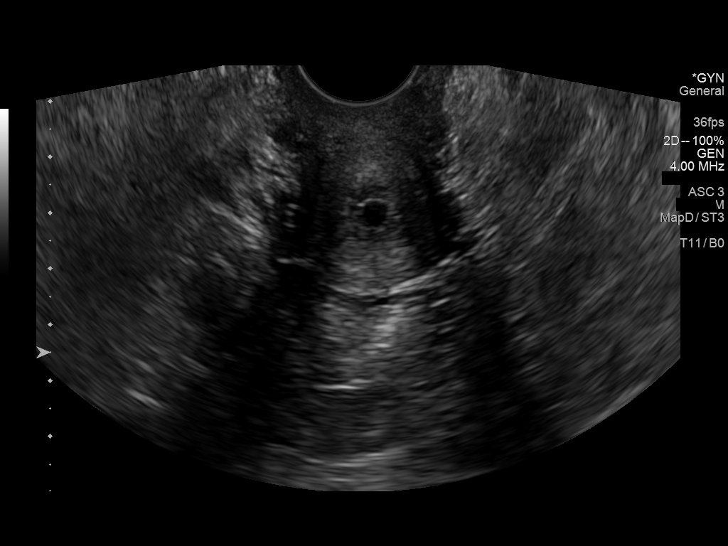
[im 48/48]
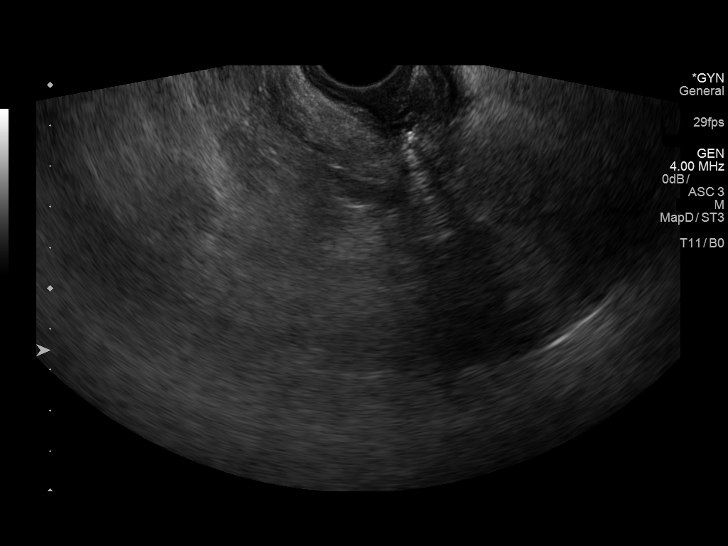

[13 of 25 positions shown; findings below may reference images not displayed]

FINDINGS: Uterus

Measurements: 11.9 x 4.9 x 8.1 cm. No fibroids or other mass
visualized.

Endometrium

Thickness: 33 mm. There is mixed echogenicity material expanding the
endometrium with associated complex fluid.

Right ovary

Not visualized.

Left ovary

Not visualized.

Other findings

No abnormal free fluid.
IMPRESSION: There is distension of the endometrial canal with mixed echogenicity
material concerning for the possibility of an endometrial mass and
associated blood products. Findings are concerning for the
possibility of endometrial carcinoma. In the setting of
post-menopausal bleeding, endometrial sampling is indicated to
exclude carcinoma. (Ref: Radiological Reasoning: Algorithmic Workup
of Abnormal Vaginal Bleeding with Endovaginal Sonography and
Sonohysterography. AJR 7113; 191:S68-73)

These results will be called to the ordering clinician or
representative by the Radiologist Assistant, and communication
documented in the PACS or zVision Dashboard.

## 2019-01-17 DIAGNOSIS — Z6841 Body Mass Index (BMI) 40.0 and over, adult: Secondary | ICD-10-CM | POA: Diagnosis not present

## 2019-01-17 DIAGNOSIS — E1169 Type 2 diabetes mellitus with other specified complication: Secondary | ICD-10-CM | POA: Diagnosis not present

## 2019-01-17 DIAGNOSIS — Z7984 Long term (current) use of oral hypoglycemic drugs: Secondary | ICD-10-CM | POA: Diagnosis not present

## 2019-01-17 DIAGNOSIS — I10 Essential (primary) hypertension: Secondary | ICD-10-CM | POA: Diagnosis not present

## 2019-11-07 DIAGNOSIS — Z1211 Encounter for screening for malignant neoplasm of colon: Secondary | ICD-10-CM | POA: Diagnosis not present

## 2019-11-07 DIAGNOSIS — I10 Essential (primary) hypertension: Secondary | ICD-10-CM | POA: Diagnosis not present

## 2019-11-07 DIAGNOSIS — E1169 Type 2 diabetes mellitus with other specified complication: Secondary | ICD-10-CM | POA: Diagnosis not present

## 2020-02-14 DIAGNOSIS — Z23 Encounter for immunization: Secondary | ICD-10-CM | POA: Diagnosis not present

## 2020-03-10 DIAGNOSIS — Z23 Encounter for immunization: Secondary | ICD-10-CM | POA: Diagnosis not present

## 2020-09-22 DIAGNOSIS — E7800 Pure hypercholesterolemia, unspecified: Secondary | ICD-10-CM | POA: Diagnosis not present

## 2020-09-22 DIAGNOSIS — M129 Arthropathy, unspecified: Secondary | ICD-10-CM | POA: Diagnosis not present

## 2020-09-22 DIAGNOSIS — I10 Essential (primary) hypertension: Secondary | ICD-10-CM | POA: Diagnosis not present

## 2020-09-22 DIAGNOSIS — Z0000 Encounter for general adult medical examination without abnormal findings: Secondary | ICD-10-CM | POA: Diagnosis not present

## 2020-09-22 DIAGNOSIS — R800 Isolated proteinuria: Secondary | ICD-10-CM | POA: Diagnosis not present

## 2020-09-22 DIAGNOSIS — Z7984 Long term (current) use of oral hypoglycemic drugs: Secondary | ICD-10-CM | POA: Diagnosis not present

## 2020-09-22 DIAGNOSIS — Z1389 Encounter for screening for other disorder: Secondary | ICD-10-CM | POA: Diagnosis not present

## 2020-09-22 DIAGNOSIS — Z1211 Encounter for screening for malignant neoplasm of colon: Secondary | ICD-10-CM | POA: Diagnosis not present

## 2020-09-22 DIAGNOSIS — E1169 Type 2 diabetes mellitus with other specified complication: Secondary | ICD-10-CM | POA: Diagnosis not present

## 2020-09-30 DIAGNOSIS — Z1211 Encounter for screening for malignant neoplasm of colon: Secondary | ICD-10-CM | POA: Diagnosis not present

## 2020-10-13 ENCOUNTER — Ambulatory Visit (INDEPENDENT_AMBULATORY_CARE_PROVIDER_SITE_OTHER): Payer: Medicare Other | Admitting: Family Medicine

## 2020-10-13 ENCOUNTER — Encounter (INDEPENDENT_AMBULATORY_CARE_PROVIDER_SITE_OTHER): Payer: Self-pay | Admitting: Family Medicine

## 2020-10-13 ENCOUNTER — Other Ambulatory Visit: Payer: Self-pay

## 2020-10-13 VITALS — BP 142/69 | HR 82 | Temp 97.9°F | Ht 62.0 in | Wt 334.0 lb

## 2020-10-13 DIAGNOSIS — R0602 Shortness of breath: Secondary | ICD-10-CM | POA: Diagnosis not present

## 2020-10-13 DIAGNOSIS — Z1331 Encounter for screening for depression: Secondary | ICD-10-CM | POA: Diagnosis not present

## 2020-10-13 DIAGNOSIS — E1159 Type 2 diabetes mellitus with other circulatory complications: Secondary | ICD-10-CM | POA: Diagnosis not present

## 2020-10-13 DIAGNOSIS — E1169 Type 2 diabetes mellitus with other specified complication: Secondary | ICD-10-CM | POA: Diagnosis not present

## 2020-10-13 DIAGNOSIS — I152 Hypertension secondary to endocrine disorders: Secondary | ICD-10-CM

## 2020-10-13 DIAGNOSIS — E785 Hyperlipidemia, unspecified: Secondary | ICD-10-CM

## 2020-10-13 DIAGNOSIS — Z6841 Body Mass Index (BMI) 40.0 and over, adult: Secondary | ICD-10-CM

## 2020-10-13 DIAGNOSIS — R5383 Other fatigue: Secondary | ICD-10-CM

## 2020-10-13 DIAGNOSIS — E1165 Type 2 diabetes mellitus with hyperglycemia: Secondary | ICD-10-CM | POA: Diagnosis not present

## 2020-10-13 DIAGNOSIS — E559 Vitamin D deficiency, unspecified: Secondary | ICD-10-CM

## 2020-10-14 LAB — COMPREHENSIVE METABOLIC PANEL
ALT: 17 IU/L (ref 0–32)
AST: 20 IU/L (ref 0–40)
Albumin/Globulin Ratio: 1.3 (ref 1.2–2.2)
Albumin: 4.5 g/dL (ref 3.8–4.9)
Alkaline Phosphatase: 100 IU/L (ref 44–121)
BUN/Creatinine Ratio: 13 (ref 12–28)
BUN: 15 mg/dL (ref 8–27)
Bilirubin Total: 0.3 mg/dL (ref 0.0–1.2)
CO2: 23 mmol/L (ref 20–29)
Calcium: 10.3 mg/dL (ref 8.7–10.3)
Chloride: 98 mmol/L (ref 96–106)
Creatinine, Ser: 1.2 mg/dL — ABNORMAL HIGH (ref 0.57–1.00)
Globulin, Total: 3.4 g/dL (ref 1.5–4.5)
Glucose: 87 mg/dL (ref 65–99)
Potassium: 4.8 mmol/L (ref 3.5–5.2)
Sodium: 138 mmol/L (ref 134–144)
Total Protein: 7.9 g/dL (ref 6.0–8.5)
eGFR: 52 mL/min/{1.73_m2} — ABNORMAL LOW (ref >59)

## 2020-10-14 LAB — INSULIN, RANDOM: INSULIN: 33 u[IU]/mL — ABNORMAL HIGH (ref 2.6–24.9)

## 2020-10-14 LAB — VITAMIN D 25 HYDROXY (VIT D DEFICIENCY, FRACTURES): Vit D, 25-Hydroxy: 9.1 ng/mL — ABNORMAL LOW (ref 30.0–100.0)

## 2020-10-14 LAB — T3: T3, Total: 132 ng/dL (ref 71–180)

## 2020-10-14 LAB — T4: T4, Total: 8.1 ug/dL (ref 4.5–12.0)

## 2020-10-14 LAB — TSH: TSH: 3.89 u[IU]/mL (ref 0.450–4.500)

## 2020-10-18 NOTE — Progress Notes (Signed)
Dear Dr. Katrinka BlazingSmith,   Thank you for referring Michelle Pearson to our clinic. The following note includes my evaluation and treatment recommendations.  Chief Complaint:   OBESITY Michelle Pearson (MR# [id number]) is a 60 y.o. female who presents for evaluation and treatment of obesity and related comorbidities. Current BMI is Body mass index is 61.09 kg/m. Michelle Pearson has been struggling with her weight for many years and has been unsuccessful in either losing weight, maintaining weight loss, or reaching her healthy weight goal.  Michelle Pearson is currently in the action stage of change and ready to dedicate time achieving and maintaining a healthier weight. Michelle Pearson is interested in becoming our patient and working on intensive lifestyle modifications including (but not limited to) diet and exercise for weight loss.  Michelle Pearson was referred by her PCP. Skipping breakfast due to no appetite. 12:30PM- 2 slices toast with pimento cheese (1 tbsp each slice) with jalapeno piece + Yoplait yogurt + fruit (blackberries, raspberries, grapes) (feel full); 5-5:30 PM cabbage + pepper + [place] sausage (1.5 cups) + bowl beans (1.5 cups); Snack hot chips or popcorn.  Michelle Pearson's habits were reviewed today and are as follows: Her family eats meals together, she thinks her family will eat healthier with her, her desired weight loss is 184 lbs, she started gaining weight in her late 8840's, her heaviest weight ever was 330 pounds, she is a picky eater and doesn't like to eat healthier foods, she has significant food cravings issues, she snacks frequently in the evenings, she skips meals frequently, she is frequently drinking liquids with calories and she frequently makes poor food choices.  Depression Screen Michelle Pearson's Food and Mood (modified PHQ-9) score was 7.  Depression screen PHQ 2/9 10/13/2020  Decreased Interest 1  Down, Depressed, Hopeless 0  PHQ - 2 Score 1  Altered sleeping 1  Tired, decreased energy 2  Change in appetite 1   Feeling bad or failure about yourself  1  Trouble concentrating 1  Moving slowly or fidgety/restless 0  Suicidal thoughts 0  PHQ-9 Score 7  Difficult doing work/chores Somewhat difficult   Subjective:   1. Other fatigue Kristee admits to daytime somnolence and denies waking up still tired. Patent has a history of symptoms of daytime fatigue. Michelle Pearson generally gets 7 or 8 hours of sleep per night, and states that she has generally restful sleep. Snoring is present. Apneic episodes are not (happened a long time ago) present. Epworth Sleepiness Score is 5. EKG normal sinus rhythm at 86 bpm; poor R-wave progression.  2. Shortness of breath on exertion Michelle Pearson notes increasing shortness of breath with exercising and seems to be worsening over time with weight gain. She notes getting out of breath sooner with activity than she used to. This has gotten worse recently. Kyli denies shortness of breath at rest or orthopnea. EKG normal sinus rhythm at 86 bpm; poor R-wave progression.  3. Type 2 diabetes mellitus with hyperglycemia, without long-term current use of insulin (HCC) Michelle Pearson's A1c 6.4. Eye exam scheduled for April. She is on Metformin 1 time a day. She was diagnosed about 1 year ago.   4. Hyperlipidemia associated with type 2 diabetes mellitus (HCC) Michelle Pearson is on statin therapy. Her last FLP revealed HDL 55, triglycerides 157, LDL 56 (09/29/2020). She denies transaminitis.   5. Hypertension associated with diabetes (HCC) Michelle Pearson BP is slightly elevated today. Pt denies chest pain, chest pressure and headache. She was diagnosed ~10 years ago.  BP Readings from Last 3  Encounters:  10/13/20 (!) 142/69  10/02/17 (!) 125/54  07/19/11 123/65   6. Vitamin D deficiency Michelle Pearson is taking Ca+Vit D. She reports fatigue.  Assessment/Plan:   1. Other fatigue Michelle Pearson does feel that her weight is causing her energy to be lower than it should be. Fatigue may be related to obesity, depression or many  other causes. Labs will be ordered, and in the meanwhile, Traniya will focus on self care including making healthy food choices, increasing physical activity and focusing on stress reduction. Check labs today.  - EKG 12-Lead - T3 - T4 - TSH  2. Shortness of breath on exertion Michelle Pearson does feel that she gets out of breath more easily that she used to when she exercises. Michelle Pearson's shortness of breath appears to be obesity related and exercise induced. She has agreed to work on weight loss and gradually increase exercise to treat her exercise induced shortness of breath. Will continue to monitor closely.  3. Type 2 diabetes mellitus with hyperglycemia, without long-term current use of insulin (HCC) Good blood sugar control is important to decrease the likelihood of diabetic complications such as nephropathy, neuropathy, limb loss, blindness, coronary artery disease, and death. Intensive lifestyle modification including diet, exercise and weight loss are the first line of treatment for diabetes. Check labs today.  - Insulin, random  4. Hyperlipidemia associated with type 2 diabetes mellitus (HCC) Cardiovascular risk and specific lipid/LDL goals reviewed.  We discussed several lifestyle modifications today and Michelle Pearson will continue to work on diet, exercise and weight loss efforts. Orders and follow up as documented in patient record. FLP in 3 months.  Counseling Intensive lifestyle modifications are the first line treatment for this issue. . Dietary changes: Increase soluble fiber. Decrease simple carbohydrates. . Exercise changes: Moderate to vigorous-intensity aerobic activity 150 minutes per week if tolerated. . Lipid-lowering medications: see documented in medical record.  5. Hypertension associated with diabetes (HCC) Michelle Pearson is working on healthy weight loss and exercise to improve blood pressure control. We will watch for signs of hypotension as she continues her lifestyle modifications. Check  labs today.  - Comprehensive metabolic panel  6. Vitamin D deficiency Low Vitamin D level contributes to fatigue and are associated with obesity, breast, and colon cancer. She agrees to continue to take OTC Ca+Vitamin D daily and will follow-up for routine testing of Vitamin D, at least 2-3 times per year to avoid over-replacement. Check labs today.  - VITAMIN D 25 Hydroxy (Vit-D Deficiency, Fractures)  7. Screening for depression Michelle Pearson had a positive depression screening. Depression is commonly associated with obesity and often results in emotional eating behaviors. We will monitor this closely and work on CBT to help improve the non-hunger eating patterns. Referral to Psychology may be required if no improvement is seen as she continues in our clinic.  8. Class 3 severe obesity with serious comorbidity and body mass index (BMI) of 60.0 to 69.9 in adult, unspecified obesity type (HCC) Michelle Pearson is currently in the action stage of change and her goal is to continue with weight loss efforts. I recommend Michelle Pearson begin the structured treatment plan as follows:  She has agreed to the Category 4 Plan.  Exercise goals: No exercise has been prescribed at this time.   Behavioral modification strategies: increasing lean protein intake, decreasing eating out and no skipping meals.  She was informed of the importance of frequent follow-up visits to maximize her success with intensive lifestyle modifications for her multiple health conditions. She was informed we  would discuss her lab results at her next visit unless there is a critical issue that needs to be addressed sooner. Michelle Pearson agreed to keep her next visit at the agreed upon time to discuss these results.  Objective:   Blood pressure (!) 142/69, pulse 82, temperature 97.9 F (36.6 C), height 5\' 2"  (1.575 m), weight (!) 334 lb (151.5 kg), SpO2 96 %. Body mass index is 61.09 kg/m.  EKG: Normal sinus rhythm, rate 86; Poor R-wave  progression.  Indirect Calorimeter completed today shows a VO2 of 382 and a REE of 2662.  Her calculated basal metabolic rate is 2663 thus her basal metabolic rate is better than expected.  General: Cooperative, alert, well developed, in no acute distress. HEENT: Conjunctivae and lids unremarkable. Cardiovascular: Regular rhythm.  Lungs: Normal work of breathing. Neurologic: No focal deficits.   Lab Results  Component Value Date   CREATININE 1.20 (H) 10/13/2020   BUN 15 10/13/2020   NA 138 10/13/2020   K 4.8 10/13/2020   CL 98 10/13/2020   CO2 23 10/13/2020   Lab Results  Component Value Date   ALT 17 10/13/2020   AST 20 10/13/2020   ALKPHOS 100 10/13/2020   BILITOT 0.3 10/13/2020   No results found for: HGBA1C Lab Results  Component Value Date   INSULIN 33.0 (H) 10/13/2020   Lab Results  Component Value Date   TSH 3.890 10/13/2020   No results found for: CHOL, HDL, LDLCALC, LDLDIRECT, TRIG, CHOLHDL Lab Results  Component Value Date   WBC 9.8 10/02/2017   HGB 11.6 (L) 10/02/2017   HCT 34.8 (L) 10/02/2017   MCV 85.1 10/02/2017   PLT 315 10/02/2017   No results found for: IRON, TIBC, FERRITIN Obesity Behavioral Intervention:   Approximately 15 minutes were spent on the discussion below.  ASK: We discussed the diagnosis of obesity with Michelle Pearson today and Michelle Pearson agreed to give 10/04/2017 permission to discuss obesity behavioral modification therapy today.  ASSESS: Samona has the diagnosis of obesity and her BMI today is 61.1. Nakyra is in the action stage of change.   ADVISE: Kenzi was educated on the multiple health risks of obesity as well as the benefit of weight loss to improve her health. She was advised of the need for long term treatment and the importance of lifestyle modifications to improve her current health and to decrease her risk of future health problems.  AGREE: Multiple dietary modification options and treatment options were discussed and Sameena agreed  to follow the recommendations documented in the above note.  ARRANGE: Navaya was educated on the importance of frequent visits to treat obesity as outlined per CMS and USPSTF guidelines and agreed to schedule her next follow up appointment today.  Attestation Statements:   Reviewed by clinician on day of visit: allergies, medications, problem list, medical history, surgical history, family history, social history, and previous encounter notes.  This is the patient's first visit at [facility]. The patient's NEW PATIENT PACKET was reviewed at length. Included in the packet: current and past health history, medications, allergies, ROS, gynecologic history (women only), surgical history, family history, social history, weight history, weight loss surgery history (for those that have had weight loss surgery), nutritional evaluation, mood and food questionnaire, PHQ9, Epworth questionnaire, sleep habits questionnaire, patient life and health improvement goals questionnaire. These will all be scanned into the patient's chart under media.   During the visit, I independently reviewed the patient's EKG, bioimpedance scale results, and indirect calorimeter results.  I used this information to tailor a meal plan for the patient that will help her to lose weight and will improve her obesity-related conditions going forward. I performed a medically necessary appropriate examination and/or evaluation. I discussed the assessment and treatment plan with the patient. The patient was provided an opportunity to ask questions and all were answered. The patient agreed with the plan and demonstrated an understanding of the instructions. Labs were ordered at this visit and will be reviewed at the next visit unless more critical results need to be addressed immediately. Clinical information was updated and documented in the EMR.   Time spent on visit including pre-visit chart review and post-visit care was 45  minutes.   A separate 15 minutes was spent on risk counseling (see above).   Edmund Hilda, am acting as transcriptionist for Reuben Likes, MD.   I have reviewed the above documentation for accuracy and completeness, and I agree with the above. - Katherina Mires, MD

## 2020-10-27 ENCOUNTER — Encounter (INDEPENDENT_AMBULATORY_CARE_PROVIDER_SITE_OTHER): Payer: Self-pay | Admitting: Family Medicine

## 2020-10-27 ENCOUNTER — Other Ambulatory Visit: Payer: Self-pay

## 2020-10-27 ENCOUNTER — Ambulatory Visit (INDEPENDENT_AMBULATORY_CARE_PROVIDER_SITE_OTHER): Payer: Medicare Other | Admitting: Family Medicine

## 2020-10-27 VITALS — BP 115/73 | HR 88 | Temp 97.9°F | Ht 62.0 in | Wt 329.0 lb

## 2020-10-27 DIAGNOSIS — E785 Hyperlipidemia, unspecified: Secondary | ICD-10-CM | POA: Diagnosis not present

## 2020-10-27 DIAGNOSIS — E1159 Type 2 diabetes mellitus with other circulatory complications: Secondary | ICD-10-CM | POA: Diagnosis not present

## 2020-10-27 DIAGNOSIS — E559 Vitamin D deficiency, unspecified: Secondary | ICD-10-CM

## 2020-10-27 DIAGNOSIS — E1165 Type 2 diabetes mellitus with hyperglycemia: Secondary | ICD-10-CM | POA: Diagnosis not present

## 2020-10-27 DIAGNOSIS — Z6841 Body Mass Index (BMI) 40.0 and over, adult: Secondary | ICD-10-CM

## 2020-10-27 DIAGNOSIS — I152 Hypertension secondary to endocrine disorders: Secondary | ICD-10-CM | POA: Diagnosis not present

## 2020-10-27 DIAGNOSIS — E1169 Type 2 diabetes mellitus with other specified complication: Secondary | ICD-10-CM

## 2020-10-27 MED ORDER — VITAMIN D (ERGOCALCIFEROL) 1.25 MG (50000 UNIT) PO CAPS: 50000.0000 [IU] | ORAL_CAPSULE | ORAL | 0 refills | Status: DC

## 2020-10-28 ENCOUNTER — Telehealth (INDEPENDENT_AMBULATORY_CARE_PROVIDER_SITE_OTHER): Payer: Self-pay | Admitting: Family Medicine

## 2020-10-28 NOTE — Progress Notes (Signed)
Chief Complaint:   OBESITY Michelle Pearson is here to discuss her progress with her obesity treatment plan along with follow-up of her obesity related diagnoses. Michelle Pearson is on the Category 4 Plan and states she is following her eating plan approximately 50% of the time. Michelle Pearson states she is not currently exercising.  Today's visit was #: 2 Starting weight: 334 lbs Starting date: 10/13/2020 Today's weight: 329 lbs Today's date: 10/27/2020 Total lbs lost to date: 5 Total lbs lost since last in-office visit: 5  Interim History: Michelle Pearson ended up starting meal plan on the 2nd week, as she had to finish food and then go grocery shopping. She felt full at every meal. She ate good thin crackers, corn chips, and yasso bars for snacks. She denies cravings. Pt had no changes that needed to be made. She is wondering how to incorporate rice or potatoes.  Subjective:   1. Vitamin D deficiency Michelle Pearson is on calcium-Vit D combo. Her Vit D level is 9.1. She reports fatigue.  2. Type 2 diabetes mellitus with hyperglycemia, without long-term current use of insulin (HCC) Michelle Pearson has a recent A1c of 6.4 and insulin level 33.0. She is on Metformin daily.  3. Hypertension associated with diabetes (HCC) Michelle Pearson's BP is very well controlled today. Pt denies chest pain, chest pressure and headache.  BP Readings from Last 3 Encounters:  10/27/20 115/73  10/13/20 (!) 142/69  10/02/17 (!) 125/54   4. Hyperlipidemia associated with type 2 diabetes mellitus (HCC) Michelle Pearson's LDL 56, HDL 55, and triglycerides 160. She denies transaminitis or myalgias.   Assessment/Plan:   1. Vitamin D deficiency Low Vitamin D level contributes to fatigue and are associated with obesity, breast, and colon cancer. She agrees to start to take prescription Vitamin D @50 ,000 IU twice a week and will follow-up for routine testing of Vitamin D, at least 2-3 times per year to avoid over-replacement.  - Vitamin D, Ergocalciferol, (DRISDOL) 1.25  MG (50000 UNIT) CAPS capsule; Take 1 capsule (50,000 Units total) by mouth 2 (two) times a week.  Dispense: 8 capsule; Refill: 0  2. Type 2 diabetes mellitus with hyperglycemia, without long-term current use of insulin (HCC) Good blood sugar control is important to decrease the likelihood of diabetic complications such as nephropathy, neuropathy, limb loss, blindness, coronary artery disease, and death. Intensive lifestyle modification including diet, exercise and weight loss are the first line of treatment for diabetes. Continue Metformin. No refill needed today.  3. Hypertension associated with diabetes (HCC) Michelle Pearson is working on healthy weight loss and exercise to improve blood pressure control. We will watch for signs of hypotension as she continues her lifestyle modifications. Follow up BP at next appointment. If BP looks as it, consider decreasing medication.  4. Hyperlipidemia associated with type 2 diabetes mellitus (HCC) Cardiovascular risk and specific lipid/LDL goals reviewed.  We discussed several lifestyle modifications today and Michelle Pearson will continue to work on diet, exercise and weight loss efforts. Orders and follow up as documented in patient record. Repeat labs in 3 months.  Counseling Intensive lifestyle modifications are the first line treatment for this issue. . Dietary changes: Increase soluble fiber. Decrease simple carbohydrates. . Exercise changes: Moderate to vigorous-intensity aerobic activity 150 minutes per week if tolerated. . Lipid-lowering medications: see documented in medical record.  5. Class 3 severe obesity with serious comorbidity and body mass index (BMI) of 60.0 to 69.9 in adult, unspecified obesity type (HCC) Michelle Pearson is currently in the action stage of change. As such,  her goal is to continue with weight loss efforts. She has agreed to the Category 4 Plan.   Exercise goals: No exercise has been prescribed at this time.  Behavioral modification strategies:  increasing lean protein intake, meal planning and cooking strategies and keeping healthy foods in the home.  Michelle Pearson has agreed to follow-up with our clinic in 2-3 weeks. She was informed of the importance of frequent follow-up visits to maximize her success with intensive lifestyle modifications for her multiple health conditions.    Objective:   Blood pressure 115/73, pulse 88, temperature 97.9 F (36.6 C), height 5\' 2"  (1.575 m), weight (!) 329 lb (149.2 kg), SpO2 96 %. Body mass index is 60.17 kg/m.  General: Cooperative, alert, well developed, in no acute distress. HEENT: Conjunctivae and lids unremarkable. Cardiovascular: Regular rhythm.  Lungs: Normal work of breathing. Neurologic: No focal deficits.   Lab Results  Component Value Date   CREATININE 1.20 (H) 10/13/2020   BUN 15 10/13/2020   NA 138 10/13/2020   K 4.8 10/13/2020   CL 98 10/13/2020   CO2 23 10/13/2020   Lab Results  Component Value Date   ALT 17 10/13/2020   AST 20 10/13/2020   ALKPHOS 100 10/13/2020   BILITOT 0.3 10/13/2020   No results found for: HGBA1C Lab Results  Component Value Date   INSULIN 33.0 (H) 10/13/2020   Lab Results  Component Value Date   TSH 3.890 10/13/2020   No results found for: CHOL, HDL, LDLCALC, LDLDIRECT, TRIG, CHOLHDL Lab Results  Component Value Date   WBC 9.8 10/02/2017   HGB 11.6 (L) 10/02/2017   HCT 34.8 (L) 10/02/2017   MCV 85.1 10/02/2017   PLT 315 10/02/2017   No results found for: IRON, TIBC, FERRITIN  Obesity Behavioral Intervention:   Approximately 15 minutes were spent on the discussion below.  ASK: We discussed the diagnosis of obesity with Michelle Pearson today and Michelle Pearson agreed to give 10/04/2017 permission to discuss obesity behavioral modification therapy today.  ASSESS: Michelle Pearson has the diagnosis of obesity and her BMI today is 60.2. Michelle Pearson is in the action stage of change.   ADVISE: Michelle Pearson was educated on the multiple health risks of obesity as well as the  benefit of weight loss to improve her health. She was advised of the need for long term treatment and the importance of lifestyle modifications to improve her current health and to decrease her risk of future health problems.  AGREE: Multiple dietary modification options and treatment options were discussed and Michelle Pearson agreed to follow the recommendations documented in the above note.  ARRANGE: Michelle Pearson was educated on the importance of frequent visits to treat obesity as outlined per CMS and USPSTF guidelines and agreed to schedule her next follow up appointment today.  Attestation Statements:   Reviewed by clinician on day of visit: allergies, medications, problem list, medical history, surgical history, family history, social history, and previous encounter notes.  Campbell Lerner, am acting as transcriptionist for Edmund Hilda, MD.   I have reviewed the above documentation for accuracy and completeness, and I agree with the above. - Reuben Likes, MD

## 2020-10-28 NOTE — Telephone Encounter (Signed)
Patient received information for Category 4 yesterday because she did not remember which plan she was on. She is on category 3 and wanted to let Dr. Lawson Radar know.

## 2020-10-29 NOTE — Telephone Encounter (Signed)
Spoke to provider and she stated patient can just do the CAT 4 that they discussed in the office. When I called patient patient stated she already brought the food for Cat 3 so she will continue on Cat 3 for now and discuss changing to Cat 4 at next visit.

## 2020-11-03 DIAGNOSIS — E1122 Type 2 diabetes mellitus with diabetic chronic kidney disease: Secondary | ICD-10-CM | POA: Diagnosis not present

## 2020-11-03 DIAGNOSIS — N181 Chronic kidney disease, stage 1: Secondary | ICD-10-CM | POA: Diagnosis not present

## 2020-11-03 DIAGNOSIS — R82998 Other abnormal findings in urine: Secondary | ICD-10-CM | POA: Diagnosis not present

## 2020-11-03 DIAGNOSIS — R809 Proteinuria, unspecified: Secondary | ICD-10-CM | POA: Diagnosis not present

## 2020-11-03 DIAGNOSIS — E1129 Type 2 diabetes mellitus with other diabetic kidney complication: Secondary | ICD-10-CM | POA: Diagnosis not present

## 2020-11-03 DIAGNOSIS — N1831 Chronic kidney disease, stage 3a: Secondary | ICD-10-CM | POA: Diagnosis not present

## 2020-11-03 DIAGNOSIS — I129 Hypertensive chronic kidney disease with stage 1 through stage 4 chronic kidney disease, or unspecified chronic kidney disease: Secondary | ICD-10-CM | POA: Diagnosis not present

## 2020-11-04 ENCOUNTER — Other Ambulatory Visit: Payer: Self-pay | Admitting: Nephrology

## 2020-11-04 DIAGNOSIS — N1831 Chronic kidney disease, stage 3a: Secondary | ICD-10-CM

## 2020-11-11 ENCOUNTER — Ambulatory Visit
Admission: RE | Admit: 2020-11-11 | Discharge: 2020-11-11 | Disposition: A | Discharge status: Home / Self Care | Payer: Medicare Other | Source: Ambulatory Visit | Attending: Nephrology | Admitting: Nephrology

## 2020-11-11 DIAGNOSIS — N189 Chronic kidney disease, unspecified: Secondary | ICD-10-CM | POA: Diagnosis not present

## 2020-11-11 DIAGNOSIS — N2889 Other specified disorders of kidney and ureter: Secondary | ICD-10-CM | POA: Diagnosis not present

## 2020-11-11 DIAGNOSIS — N1831 Chronic kidney disease, stage 3a: Secondary | ICD-10-CM

## 2020-11-11 DIAGNOSIS — N281 Cyst of kidney, acquired: Secondary | ICD-10-CM | POA: Diagnosis not present

## 2020-11-23 ENCOUNTER — Ambulatory Visit (INDEPENDENT_AMBULATORY_CARE_PROVIDER_SITE_OTHER): Payer: Medicare Other | Admitting: Family Medicine

## 2020-11-23 ENCOUNTER — Other Ambulatory Visit: Payer: Self-pay

## 2020-11-23 ENCOUNTER — Encounter (INDEPENDENT_AMBULATORY_CARE_PROVIDER_SITE_OTHER): Payer: Self-pay | Admitting: Family Medicine

## 2020-11-23 VITALS — BP 119/78 | HR 90 | Temp 98.4°F | Ht 62.0 in | Wt 322.0 lb

## 2020-11-23 DIAGNOSIS — E559 Vitamin D deficiency, unspecified: Secondary | ICD-10-CM | POA: Diagnosis not present

## 2020-11-23 DIAGNOSIS — I152 Hypertension secondary to endocrine disorders: Secondary | ICD-10-CM

## 2020-11-23 DIAGNOSIS — Z6841 Body Mass Index (BMI) 40.0 and over, adult: Secondary | ICD-10-CM | POA: Diagnosis not present

## 2020-11-23 DIAGNOSIS — E1159 Type 2 diabetes mellitus with other circulatory complications: Secondary | ICD-10-CM | POA: Diagnosis not present

## 2020-11-23 MED ORDER — VITAMIN D (ERGOCALCIFEROL) 1.25 MG (50000 UNIT) PO CAPS: 50000.0000 [IU] | ORAL_CAPSULE | ORAL | 0 refills | Status: DC

## 2020-11-24 NOTE — Progress Notes (Signed)
Chief Complaint:   OBESITY Michelle Pearson is here to discuss her progress with her obesity treatment plan along with follow-up of her obesity related diagnoses. Michelle Pearson is on the Category 4 Plan and states she is following her eating plan approximately 90% of the time. Hanifa states she is walking 15-20 minutes 5 times per week.  Today's visit was #: 3 Starting weight: 334 lbs Starting date: 10/13/2020 Today's weight: 322 lbs Today's date: 11/23/2020 Total lbs lost to date: 12 Total lbs lost since last in-office visit: 7  Interim History: Last few weeks, Michelle Pearson has started doing a bit of walking on her treadmill and following meal plan at least 90% of the time. She occasionally feels like she is eating too much. She is not sure what she has going on for her birthday. Pt would like to continue on Category 4.  Subjective:   1. Vitamin D deficiency Pt denies nausea, vomiting, and muscle weakness but notes fatigue. Pt is on prescription Vit D.  2. Hypertension associated with diabetes (HCC) Pt's BP is well controlled today. Pt denies chest pain/chest pressure/headache. Medications were split into 2- lisinopril and HCTZ.  Assessment/Plan:   1. Vitamin D deficiency Low Vitamin D level contributes to fatigue and are associated with obesity, breast, and colon cancer. She agrees to continue to take prescription Vitamin D @50 ,000 IU every week and will follow-up for routine testing of Vitamin D, at least 2-3 times per year to avoid over-replacement. - Vitamin D, Ergocalciferol, (DRISDOL) 1.25 MG (50000 UNIT) CAPS capsule; Take 1 capsule (50,000 Units total) by mouth every 7 (seven) days.  Dispense: 8 capsule; Refill: 0  2. Hypertension associated with diabetes (HCC) Athenia is working on healthy weight loss and exercise to improve blood pressure control. We will watch for signs of hypotension as she continues her lifestyle modifications. -Follow up with pt concerning dosage.  3. Class 3 severe  obesity with serious comorbidity and body mass index (BMI) of 60.0 to 69.9 in adult, unspecified obesity type (HCC)  Michelle Pearson is currently in the action stage of change. As such, her goal is to continue with weight loss efforts. She has agreed to the Category 4 Plan.   Exercise goals: As is  Behavioral modification strategies: increasing lean protein intake, meal planning and cooking strategies, keeping healthy foods in the home and planning for success.  Michelle Pearson has agreed to follow-up with our clinic in 2-3 weeks. She was informed of the importance of frequent follow-up visits to maximize her success with intensive lifestyle modifications for her multiple health conditions.   Objective:   Blood pressure 119/78, pulse 90, temperature 98.4 F (36.9 C), height 5\' 2"  (1.575 m), weight (!) 322 lb (146.1 kg), SpO2 98 %. Body mass index is 58.89 kg/m.  General: Cooperative, alert, well developed, in no acute distress. HEENT: Conjunctivae and lids unremarkable. Cardiovascular: Regular rhythm.  Lungs: Normal work of breathing. Neurologic: No focal deficits.   Lab Results  Component Value Date   CREATININE 1.20 (H) 10/13/2020   BUN 15 10/13/2020   NA 138 10/13/2020   K 4.8 10/13/2020   CL 98 10/13/2020   CO2 23 10/13/2020   Lab Results  Component Value Date   ALT 17 10/13/2020   AST 20 10/13/2020   ALKPHOS 100 10/13/2020   BILITOT 0.3 10/13/2020   No results found for: HGBA1C Lab Results  Component Value Date   INSULIN 33.0 (H) 10/13/2020   Lab Results  Component Value Date  TSH 3.890 10/13/2020   No results found for: CHOL, HDL, LDLCALC, LDLDIRECT, TRIG, CHOLHDL Lab Results  Component Value Date   WBC 9.8 10/02/2017   HGB 11.6 (L) 10/02/2017   HCT 34.8 (L) 10/02/2017   MCV 85.1 10/02/2017   PLT 315 10/02/2017   No results found for: IRON, TIBC, FERRITIN  Obesity Behavioral Intervention:   Approximately 15 minutes were spent on the discussion below.  ASK: We  discussed the diagnosis of obesity with Reanna today and Shimeka agreed to give [place] permission to discuss obesity behavioral modification therapy today.  ASSESS: Anaih has the diagnosis of obesity and her BMI today is 59. Glinda is in the action stage of change.   ADVISE: Annalyn was educated on the multiple health risks of obesity as well as the benefit of weight loss to improve her health. She was advised of the need for long term treatment and the importance of lifestyle modifications to improve her current health and to decrease her risk of future health problems.  AGREE: Multiple dietary modification options and treatment options were discussed and Norita agreed to follow the recommendations documented in the above note.  ARRANGE: Maurita was educated on the importance of frequent visits to treat obesity as outlined per CMS and USPSTF guidelines and agreed to schedule her next follow up appointment today.  Attestation Statements:   Reviewed by clinician on day of visit: allergies, medications, problem list, medical history, surgical history, family history, social history, and previous encounter notes.  Edmund Hilda, CMA, am acting as transcriptionist for Reuben Likes, MD.   I have reviewed the above documentation for accuracy and completeness, and I agree with the above. - Katherina Mires, MD

## 2020-12-22 ENCOUNTER — Other Ambulatory Visit: Payer: Self-pay

## 2020-12-22 ENCOUNTER — Ambulatory Visit (INDEPENDENT_AMBULATORY_CARE_PROVIDER_SITE_OTHER): Payer: Medicare Other | Admitting: Bariatrics

## 2020-12-22 ENCOUNTER — Encounter (INDEPENDENT_AMBULATORY_CARE_PROVIDER_SITE_OTHER): Payer: Self-pay | Admitting: Bariatrics

## 2020-12-22 ENCOUNTER — Ambulatory Visit (INDEPENDENT_AMBULATORY_CARE_PROVIDER_SITE_OTHER): Payer: Medicare Other | Admitting: Family Medicine

## 2020-12-22 VITALS — BP 120/80 | HR 77 | Temp 98.1°F | Ht 62.0 in | Wt 315.0 lb

## 2020-12-22 DIAGNOSIS — Z6841 Body Mass Index (BMI) 40.0 and over, adult: Secondary | ICD-10-CM

## 2020-12-22 DIAGNOSIS — E669 Obesity, unspecified: Secondary | ICD-10-CM

## 2020-12-22 DIAGNOSIS — E1169 Type 2 diabetes mellitus with other specified complication: Secondary | ICD-10-CM

## 2020-12-22 DIAGNOSIS — E559 Vitamin D deficiency, unspecified: Secondary | ICD-10-CM

## 2020-12-29 NOTE — Progress Notes (Addendum)
Chief Complaint:   OBESITY Michelle Pearson is here to discuss her progress with her obesity treatment plan along with follow-up of her obesity related diagnoses. Michelle Pearson is on the Category 4 Plan and states Michelle Pearson is following her eating plan approximately 85-90% of the time. Michelle Pearson states Michelle Pearson is walking for 15 minutes 5 times per week.  Today's visit was #: 4 Starting weight: 334 lbs Starting date: 10/13/2020 Today's weight: 315 lbs Today's date: 12/22/2020 Total lbs lost to date: 19 lbs Total lbs lost since last in-office visit: 7 lbs  Interim History: Michelle Pearson is down another 7 pounds and is doing well.  Michelle Pearson has not struggled with anything.  Michelle Pearson is drinking adequate water and getting in her protein.  Subjective:   1. Diabetes mellitus type 2 in obese [facility]) Michelle Pearson is taking metformin.  No excessive hunger.  No cramps.  Lab Results  Component Value Date   CREATININE 1.20 (H) 10/13/2020   Lab Results  Component Value Date   INSULIN 33.0 (H) 10/13/2020   2. Vitamin D deficiency Michelle Pearson Vitamin D level was 9.1 on 10/13/2020. Michelle Pearson is currently taking prescription vitamin D 50,000 IU each week. Michelle Pearson denies nausea, vomiting or muscle weakness.  Assessment/Plan:   1. Diabetes mellitus type 2 in obese (HCC) Good blood sugar control is important to decrease the likelihood of diabetic complications such as nephropathy, neuropathy, limb loss, blindness, coronary artery disease, and death. Intensive lifestyle modification including diet, exercise and weight loss are the first line of treatment for diabetes. Continue metformin.  Decrease carbohydrates and increase activity.    2. Vitamin D deficiency Low Vitamin D level contributes to fatigue and are associated with obesity, breast, and colon cancer. Michelle Pearson agrees to continue to take prescription Vitamin D @50 ,000 IU every week and will follow-up for routine testing of Vitamin D, at least 2-3 times per year to avoid over-replacement.  Michelle Pearson has sufficient  vitamin D at this time.  3. Obesity, current BMI 57  Michelle Pearson is currently in the action stage of change. As such, her goal is to continue with weight loss efforts. Michelle Pearson has agreed to the Category 4 Plan.   Michelle Pearson will work on staying strictly adherent to the plan and meal planning.  Exercise goals:  As is.  Behavioral modification strategies: increasing lean protein intake, decreasing simple carbohydrates, increasing vegetables, increasing water intake, decreasing eating out, no skipping meals, meal planning and cooking strategies, keeping healthy foods in the home, and planning for success.  Michelle Pearson has agreed to follow-up with Michelle Pearson in 2-3 weeks with Michelle Pearson. Michelle Pearson was informed of the importance of frequent follow-up visits to maximize her success with intensive lifestyle modifications for her multiple health conditions.   Objective:   Blood pressure 120/80, pulse 77, temperature 98.1 F (36.7 C), height 5\' 2"  (1.575 m), weight (!) 315 lb (142.9 kg), SpO2 96 %. Body mass index is 57.61 kg/m.  General: Cooperative, alert, well developed, in no acute distress. HEENT: Conjunctivae and lids unremarkable. Cardiovascular: Regular rhythm.  Lungs: Normal work of breathing. Neurologic: No focal deficits.   Lab Results  Component Value Date   CREATININE 1.20 (H) 10/13/2020   BUN 15 10/13/2020   NA 138 10/13/2020   K 4.8 10/13/2020   CL 98 10/13/2020   CO2 23 10/13/2020   Lab Results  Component Value Date   ALT 17 10/13/2020   AST 20 10/13/2020   ALKPHOS 100 10/13/2020   BILITOT 0.3 10/13/2020   Lab Results  Component Value Date   INSULIN 33.0 (H) 10/13/2020   Lab Results  Component Value Date   TSH 3.890 10/13/2020   Lab Results  Component Value Date   WBC 9.8 10/02/2017   HGB 11.6 (L) 10/02/2017   HCT 34.8 (L) 10/02/2017   MCV 85.1 10/02/2017   PLT 315 10/02/2017   Obesity Behavioral Intervention:   Approximately 15 minutes were spent on the discussion  below.  ASK: We discussed the diagnosis of obesity with Michelle Pearson today and Michelle Pearson agreed to give [place] permission to discuss obesity behavioral modification therapy today.  ASSESS: Michelle Pearson has the diagnosis of obesity and her BMI today is 57.6. Michelle Pearson is in the action stage of change.   ADVISE: Michelle Pearson was educated on the multiple health risks of obesity as well as the benefit of weight loss to improve her health. Michelle Pearson was advised of the need for long term treatment and the importance of lifestyle modifications to improve her current health and to decrease her risk of future health problems.  AGREE: Multiple dietary modification options and treatment options were discussed and Michelle Pearson agreed to follow the recommendations documented in the above note.  ARRANGE: Michelle Pearson was educated on the importance of frequent visits to treat obesity as outlined per CMS and USPSTF guidelines and agreed to schedule her next follow up appointment today.  Attestation Statements:   Reviewed by clinician on day of visit: allergies, medications, problem list, medical history, surgical history, family history, social history, and previous encounter notes.  I, Insurance claims handler, CMA, am acting as Energy manager for [employer], DO  I have reviewed the above documentation for accuracy and completeness, and I agree with the above. Corinna Capra, DO

## 2020-12-31 ENCOUNTER — Encounter (INDEPENDENT_AMBULATORY_CARE_PROVIDER_SITE_OTHER): Payer: Self-pay | Admitting: Bariatrics

## 2021-01-13 ENCOUNTER — Other Ambulatory Visit: Payer: Self-pay

## 2021-01-13 ENCOUNTER — Ambulatory Visit (INDEPENDENT_AMBULATORY_CARE_PROVIDER_SITE_OTHER): Payer: Medicare Other | Admitting: Family Medicine

## 2021-01-13 ENCOUNTER — Encounter (INDEPENDENT_AMBULATORY_CARE_PROVIDER_SITE_OTHER): Payer: Self-pay | Admitting: Family Medicine

## 2021-01-13 VITALS — BP 125/83 | HR 81 | Temp 98.2°F | Ht 62.0 in | Wt 312.0 lb

## 2021-01-13 DIAGNOSIS — Z6841 Body Mass Index (BMI) 40.0 and over, adult: Secondary | ICD-10-CM | POA: Diagnosis not present

## 2021-01-13 DIAGNOSIS — I152 Hypertension secondary to endocrine disorders: Secondary | ICD-10-CM

## 2021-01-13 DIAGNOSIS — E1159 Type 2 diabetes mellitus with other circulatory complications: Secondary | ICD-10-CM

## 2021-01-13 DIAGNOSIS — E559 Vitamin D deficiency, unspecified: Secondary | ICD-10-CM | POA: Diagnosis not present

## 2021-01-13 MED ORDER — VITAMIN D (ERGOCALCIFEROL) 1.25 MG (50000 UNIT) PO CAPS
50000.0000 [IU] | ORAL_CAPSULE | ORAL | 0 refills | Status: DC
Start: 2021-01-13 — End: 2021-02-23

## 2021-01-18 NOTE — Progress Notes (Signed)
Chief Complaint:   OBESITY Michelle Pearson is here to discuss her progress with her obesity treatment plan along with follow-up of her obesity related diagnoses. Michelle Pearson is on the Category 4 Plan and states she is following her eating plan approximately 65-70% of the time. Michelle Pearson states she is walking 15 minutes 5 times per week.  Today's visit was #: 5 Starting weight: 334 lbs Starting date: 10/13/2020 Today's weight: 312 lbs Today's date: 01/13/2021 Total lbs lost to date: 22 Total lbs lost since last in-office visit: 3  Interim History: Michelle Pearson is interested in substituting mayo into plan and substitutes [place] bacon. She thinks she may have eaten too much on the 4th of July in terms of indulgences. Snack calories include watermelon, peanuts, and protein bar.  Subjective:   1. Vitamin D deficiency Pt denies nausea, vomiting, and muscle weakness but notes fatigue. She is on prescription Vit D. Her last Vit D level was 9.1.  2. Hypertension associated with diabetes (HCC) BP well controlled. Pt denies chest pain/chest pressure/headache.  Assessment/Plan:   1. Vitamin D deficiency Low Vitamin D level contributes to fatigue and are associated with obesity, breast, and colon cancer. She agrees to continue to take prescription Vitamin D @50 ,000 IU every week and will follow-up for routine testing of Vitamin D, at least 2-3 times per year to avoid over-replacement.  Refill- Vitamin D, Ergocalciferol, (DRISDOL) 1.25 MG (50000 UNIT) CAPS capsule; Take 1 capsule (50,000 Units total) by mouth every 7 (seven) days.  Dispense: 8 capsule; Refill: 0  2. Hypertension associated with diabetes (HCC) Michelle Pearson is working on healthy weight loss and exercise to improve blood pressure control. We will watch for signs of hypotension as she continues her lifestyle modifications. Continue lisinopril/HCTZ.  3. Obesity, current BMI 57  Michelle Pearson is currently in the action stage of change. As such, her goal is to  continue with weight loss efforts. She has agreed to the Category 4 Plan.   Exercise goals:  As is  Behavioral modification strategies: increasing lean protein intake, meal planning and cooking strategies, keeping healthy foods in the home, and planning for success.  Michelle Pearson has agreed to follow-up with our clinic in 3 weeks. She was informed of the importance of frequent follow-up visits to maximize her success with intensive lifestyle modifications for her multiple health conditions.   Objective:   Blood pressure 125/83, pulse 81, temperature 98.2 F (36.8 C), height 5\' 2"  (1.575 m), weight (!) 312 lb (141.5 kg), SpO2 96 %. Body mass index is 57.07 kg/m.  General: Cooperative, alert, well developed, in no acute distress. HEENT: Conjunctivae and lids unremarkable. Cardiovascular: Regular rhythm.  Lungs: Normal work of breathing. Neurologic: No focal deficits.   Lab Results  Component Value Date   CREATININE 1.20 (H) 10/13/2020   BUN 15 10/13/2020   NA 138 10/13/2020   K 4.8 10/13/2020   CL 98 10/13/2020   CO2 23 10/13/2020   Lab Results  Component Value Date   ALT 17 10/13/2020   AST 20 10/13/2020   ALKPHOS 100 10/13/2020   BILITOT 0.3 10/13/2020   No results found for: HGBA1C Lab Results  Component Value Date   INSULIN 33.0 (H) 10/13/2020   Lab Results  Component Value Date   TSH 3.890 10/13/2020   No results found for: CHOL, HDL, LDLCALC, LDLDIRECT, TRIG, CHOLHDL Lab Results  Component Value Date   VD25OH 9.1 (L) 10/13/2020   Lab Results  Component Value Date   WBC 9.8 10/02/2017  HGB 11.6 (L) 10/02/2017   HCT 34.8 (L) 10/02/2017   MCV 85.1 10/02/2017   PLT 315 10/02/2017   No results found for: IRON, TIBC, FERRITIN  Obesity Behavioral Intervention:   Approximately 15 minutes were spent on the discussion below.  ASK: We discussed the diagnosis of obesity with Michelle Pearson today and Michelle Pearson agreed to give [place] permission to discuss obesity behavioral  modification therapy today.  ASSESS: Michelle Pearson has the diagnosis of obesity and her BMI today is 57.2. Michelle Pearson is in the action stage of change.   ADVISE: Michelle Pearson was educated on the multiple health risks of obesity as well as the benefit of weight loss to improve her health. She was advised of the need for long term treatment and the importance of lifestyle modifications to improve her current health and to decrease her risk of future health problems.  AGREE: Multiple dietary modification options and treatment options were discussed and Michelle Pearson agreed to follow the recommendations documented in the above note.  ARRANGE: Michelle Pearson was educated on the importance of frequent visits to treat obesity as outlined per CMS and USPSTF guidelines and agreed to schedule her next follow up appointment today.  Attestation Statements:   Reviewed by clinician on day of visit: allergies, medications, problem list, medical history, surgical history, family history, social history, and previous encounter notes.  Edmund Hilda, CMA, am acting as transcriptionist for Reuben Likes, MD.   I have reviewed the above documentation for accuracy and completeness, and I agree with the above. - Reuben Likes, MD

## 2021-02-02 ENCOUNTER — Other Ambulatory Visit: Payer: Self-pay

## 2021-02-02 ENCOUNTER — Encounter (INDEPENDENT_AMBULATORY_CARE_PROVIDER_SITE_OTHER): Payer: Self-pay | Admitting: Family Medicine

## 2021-02-02 ENCOUNTER — Ambulatory Visit (INDEPENDENT_AMBULATORY_CARE_PROVIDER_SITE_OTHER): Payer: Medicare Other | Admitting: Family Medicine

## 2021-02-02 VITALS — BP 138/78 | HR 81 | Temp 97.9°F | Ht 62.0 in | Wt 309.0 lb

## 2021-02-02 DIAGNOSIS — E1159 Type 2 diabetes mellitus with other circulatory complications: Secondary | ICD-10-CM

## 2021-02-02 DIAGNOSIS — E559 Vitamin D deficiency, unspecified: Secondary | ICD-10-CM | POA: Diagnosis not present

## 2021-02-02 DIAGNOSIS — Z6841 Body Mass Index (BMI) 40.0 and over, adult: Secondary | ICD-10-CM

## 2021-02-02 DIAGNOSIS — I152 Hypertension secondary to endocrine disorders: Secondary | ICD-10-CM | POA: Diagnosis not present

## 2021-02-06 NOTE — Progress Notes (Signed)
Chief Complaint:   OBESITY Michelle Pearson is here to discuss her progress with her obesity treatment plan along with follow-up of her obesity related diagnoses. Michelle Pearson is on the Category 4 Plan and states she is following her eating plan approximately 50-60% of the time. Michelle Pearson states she is walking 15 minutes 5 times per week.  Today's visit was #: 6 Starting weight: 334 lbs Starting date: 10/13/2020 Today's weight: 309 lbs Today's date: 02/02/2021 Total lbs lost to date: 25  Total lbs lost since last in-office visit: 3  Interim History: Michelle Pearson has been doing well the last 3 weeks. She hasn't been doing as much activity due to knee pain. She is tired of eggs and is looking for breakfast options. She is doing alright for lunch and dinner and is getting all food in. She doesn't foresee any obstacles in the next few weeks.  Subjective:   1. Hypertension associated with diabetes (HCC) Michelle Pearson's BP is controlled today. Pt denies chest pain/chest pressure/headache. She is on lisinopril 20 mg and HCTZ 12.5 mg.  2. Vitamin D deficiency [name] denies nausea, vomiting, and muscle weakness but notes fatigue. She is on prescription Vit D. Her last Vit D level was 9.1.  Assessment/Plan:   1. Hypertension associated with diabetes (HCC) Michelle Pearson is working on healthy weight loss and exercise to improve blood pressure control. We will watch for signs of hypotension as she continues her lifestyle modifications. Continue current treatment plan.  2. Vitamin D deficiency Low Vitamin D level contributes to fatigue and are associated with obesity, breast, and colon cancer. She agrees to continue to take prescription Vitamin D @50 ,000 IU every week and will follow-up for routine testing of Vitamin D, at least 2-3 times per year to avoid over-replacement.  3. Obesity, current BMI 56  Michelle Pearson is currently in the action stage of change. As such, her goal is to continue with weight loss efforts. She has agreed to  the Category 4 Plan with breakfast options.   Exercise goals:  As is  Behavioral modification strategies: increasing lean protein intake, meal planning and cooking strategies, keeping healthy foods in the home, and planning for success.  Michelle Pearson has agreed to follow-up with our clinic in 3 weeks. She was informed of the importance of frequent follow-up visits to maximize her success with intensive lifestyle modifications for her multiple health conditions.   Objective:   Blood pressure 138/78, pulse 81, temperature 97.9 F (36.6 C), height 5\' 2"  (1.575 m), weight (!) 309 lb (140.2 kg), SpO2 97 %. Body mass index is 56.52 kg/m.  General: Cooperative, alert, well developed, in no acute distress. HEENT: Conjunctivae and lids unremarkable. Cardiovascular: Regular rhythm.  Lungs: Normal work of breathing. Neurologic: No focal deficits.   Lab Results  Component Value Date   CREATININE 1.20 (H) 10/13/2020   BUN 15 10/13/2020   NA 138 10/13/2020   K 4.8 10/13/2020   CL 98 10/13/2020   CO2 23 10/13/2020   Lab Results  Component Value Date   ALT 17 10/13/2020   AST 20 10/13/2020   ALKPHOS 100 10/13/2020   BILITOT 0.3 10/13/2020   No results found for: HGBA1C Lab Results  Component Value Date   INSULIN 33.0 (H) 10/13/2020   Lab Results  Component Value Date   TSH 3.890 10/13/2020   No results found for: CHOL, HDL, LDLCALC, LDLDIRECT, TRIG, CHOLHDL Lab Results  Component Value Date   VD25OH 9.1 (L) 10/13/2020   Lab Results  Component Value  Date   WBC 9.8 10/02/2017   HGB 11.6 (L) 10/02/2017   HCT 34.8 (L) 10/02/2017   MCV 85.1 10/02/2017   PLT 315 10/02/2017   No results found for: IRON, TIBC, FERRITIN  Attestation Statements:   Reviewed by clinician on day of visit: allergies, medications, problem list, medical history, surgical history, family history, social history, and previous encounter notes.  Edmund Hilda, CMA, am acting as transcriptionist for  Reuben Likes, MD.  I have reviewed the above documentation for accuracy and completeness, and I agree with the above. - Reuben Likes, MD

## 2021-02-23 ENCOUNTER — Other Ambulatory Visit: Payer: Self-pay

## 2021-02-23 ENCOUNTER — Ambulatory Visit (INDEPENDENT_AMBULATORY_CARE_PROVIDER_SITE_OTHER): Payer: Medicare Other | Admitting: Family Medicine

## 2021-02-23 ENCOUNTER — Encounter (INDEPENDENT_AMBULATORY_CARE_PROVIDER_SITE_OTHER): Payer: Self-pay | Admitting: Family Medicine

## 2021-02-23 VITALS — BP 129/75 | HR 79 | Temp 98.1°F | Ht 62.0 in | Wt 307.0 lb

## 2021-02-23 DIAGNOSIS — E1165 Type 2 diabetes mellitus with hyperglycemia: Secondary | ICD-10-CM

## 2021-02-23 DIAGNOSIS — Z6841 Body Mass Index (BMI) 40.0 and over, adult: Secondary | ICD-10-CM | POA: Diagnosis not present

## 2021-02-23 DIAGNOSIS — E559 Vitamin D deficiency, unspecified: Secondary | ICD-10-CM | POA: Diagnosis not present

## 2021-02-23 MED ORDER — VITAMIN D (ERGOCALCIFEROL) 1.25 MG (50000 UNIT) PO CAPS: 50000.0000 [IU] | ORAL_CAPSULE | ORAL | 0 refills | Status: DC

## 2021-02-24 NOTE — Progress Notes (Signed)
Chief Complaint:   OBESITY Michelle Pearson is here to discuss her progress with her obesity treatment plan along with follow-up of her obesity related diagnoses. Michelle Pearson is on the Category 4 Plan and states she is following her eating plan approximately 70-80% of the time. Michelle Pearson states she is walking 15 minutes 5 times per week.  Today's visit was #: 7 Starting weight: 334 lbs Starting date: 10/13/2020 Today's weight: 307 lbs Today's date: 02/23/2021 Total lbs lost to date: 27 Total lbs lost since last in-office visit: 2  Interim History: Over the last few weeks, Chosen had a few indulgences of cheese air puffs. She has gotten all the rest of the food in. She is planning on being home the next few weeks. No travel in the next few weeks. Pt denies hunger and enjoys the structure of category 4.  Subjective:   1. Vitamin D deficiency Michelle Pearson is on prescription Vit D. Her last Vit D level was 9.1.  2. Type 2 diabetes mellitus with hyperglycemia, without long-term current use of insulin (HCC) She is on Metformin and denies side effects.  Assessment/Plan:   1. Vitamin D deficiency Low Vitamin D level contributes to fatigue and are associated with obesity, breast, and colon cancer. She agrees to continue to take prescription Vitamin D 50,000 IU every week and will follow-up for routine testing of Vitamin D, at least 2-3 times per year to avoid over-replacement.  Refill- Vitamin D, Ergocalciferol, (DRISDOL) 1.25 MG (50000 UNIT) CAPS capsule; Take 1 capsule (50,000 Units total) by mouth every 7 (seven) days.  Dispense: 8 capsule; Refill: 0  2. Type 2 diabetes mellitus with hyperglycemia, without long-term current use of insulin (HCC) Good blood sugar control is important to decrease the likelihood of diabetic complications such as nephropathy, neuropathy, limb loss, blindness, coronary artery disease, and death. Intensive lifestyle modification including diet, exercise and weight loss are the first  line of treatment for diabetes. Repeat labs in 2 months.  3. Obesity with current BMI of 56.3  Michelle Pearson is currently in the action stage of change. As such, her goal is to continue with weight loss efforts. She has agreed to the Category 4 Plan.   Exercise goals: All adults should avoid inactivity. Some physical activity is better than none, and adults who participate in any amount of physical activity gain some health benefits. Start 10-15 minutes of resistance training 2-3 times a week.  Behavioral modification strategies: increasing lean protein intake, meal planning and cooking strategies, keeping healthy foods in the home, and planning for success.  Michelle Pearson has agreed to follow-up with our clinic in 4 weeks. She was informed of the importance of frequent follow-up visits to maximize her success with intensive lifestyle modifications for her multiple health conditions.    Objective:   Blood pressure 129/75, pulse 79, temperature 98.1 F (36.7 C), height 5\' 2"  (1.575 m), weight (!) 307 lb (139.3 kg), SpO2 98 %. Body mass index is 56.15 kg/m.  General: Cooperative, alert, well developed, in no acute distress. HEENT: Conjunctivae and lids unremarkable. Cardiovascular: Regular rhythm.  Lungs: Normal work of breathing. Neurologic: No focal deficits.   Lab Results  Component Value Date   CREATININE 1.20 (H) 10/13/2020   BUN 15 10/13/2020   NA 138 10/13/2020   K 4.8 10/13/2020   CL 98 10/13/2020   CO2 23 10/13/2020   Lab Results  Component Value Date   ALT 17 10/13/2020   AST 20 10/13/2020   ALKPHOS 100 10/13/2020  BILITOT 0.3 10/13/2020   No results found for: HGBA1C Lab Results  Component Value Date   INSULIN 33.0 (H) 10/13/2020   Lab Results  Component Value Date   TSH 3.890 10/13/2020   No results found for: CHOL, HDL, LDLCALC, LDLDIRECT, TRIG, CHOLHDL Lab Results  Component Value Date   VD25OH 9.1 (L) 10/13/2020   Lab Results  Component Value Date   WBC 9.8  10/02/2017   HGB 11.6 (L) 10/02/2017   HCT 34.8 (L) 10/02/2017   MCV 85.1 10/02/2017   PLT 315 10/02/2017   No results found for: IRON, TIBC, FERRITIN  Obesity Behavioral Intervention:   Approximately 15 minutes were spent on the discussion below.  ASK: We discussed the diagnosis of obesity with Damoni today and Sharmane agreed to give [place] permission to discuss obesity behavioral modification therapy today.  ASSESS: Tiwanda has the diagnosis of obesity and her BMI today is 56.3. Abbigale is in the action stage of change.   ADVISE: Kenitha was educated on the multiple health risks of obesity as well as the benefit of weight loss to improve her health. She was advised of the need for long term treatment and the importance of lifestyle modifications to improve her current health and to decrease her risk of future health problems.  AGREE: Multiple dietary modification options and treatment options were discussed and Verline agreed to follow the recommendations documented in the above note.  ARRANGE: Kiaya was educated on the importance of frequent visits to treat obesity as outlined per CMS and USPSTF guidelines and agreed to schedule her next follow up appointment today.  Attestation Statements:   Reviewed by clinician on day of visit: allergies, medications, problem list, medical history, surgical history, family history, social history, and previous encounter notes.  Edmund Hilda, CMA, am acting as transcriptionist for Reuben Likes, MD.   I have reviewed the above documentation for accuracy and completeness, and I agree with the above. - Reuben Likes, MD

## 2021-03-23 ENCOUNTER — Other Ambulatory Visit: Payer: Self-pay

## 2021-03-23 ENCOUNTER — Ambulatory Visit (INDEPENDENT_AMBULATORY_CARE_PROVIDER_SITE_OTHER): Payer: Medicare Other | Admitting: Family Medicine

## 2021-03-23 ENCOUNTER — Encounter (INDEPENDENT_AMBULATORY_CARE_PROVIDER_SITE_OTHER): Payer: Self-pay | Admitting: Family Medicine

## 2021-03-23 VITALS — BP 140/75 | HR 78 | Temp 98.1°F

## 2021-03-23 DIAGNOSIS — E559 Vitamin D deficiency, unspecified: Secondary | ICD-10-CM

## 2021-03-23 DIAGNOSIS — Z6841 Body Mass Index (BMI) 40.0 and over, adult: Secondary | ICD-10-CM

## 2021-03-23 DIAGNOSIS — I10 Essential (primary) hypertension: Secondary | ICD-10-CM | POA: Diagnosis not present

## 2021-03-23 NOTE — Progress Notes (Signed)
Chief Complaint:   OBESITY Michelle Pearson is here to discuss her progress with her obesity treatment plan along with follow-up of her obesity related diagnoses. Michelle Pearson is on the Category 4 Plan and states she is following her eating plan approximately 75-80% of the time. Michelle Pearson states she is walking and doing band exercises 15-30 minutes 3-5 times per week.  Today's visit was #: 8 Starting weight: 334 lbs Starting date: 10/13/2020 Today's weight: 304 lbs Today's date: 03/23/2021 Total lbs lost to date: 30 Total lbs lost since last in-office visit: 3  Interim History: Michelle Pearson's stove went out this morning and so she is likely needing to buy a new one. She had a good few weeks otherwise. She did a good amount of shopping over Labor Day. She has been eating off plan a small amount and not getting it all in maybe 2 days.  Subjective:   1. Essential hypertension BP controlled previously. Pt denies chest pain/chest pressure/headache. Pt is on lisinopril and Microzide.  2. Vitamin D deficiency Michelle Pearson denies nausea, vomiting, and muscle weakness but notes fatigue. She is on prescription Vit D.  Assessment/Plan:   1. Essential hypertension Michelle Pearson is working on healthy weight loss and exercise to improve blood pressure control. We will watch for signs of hypotension as she continues her lifestyle modifications. Continue current meds with no change in dose or meds.  2. Vitamin D deficiency Low Vitamin D level contributes to fatigue and are associated with obesity, breast, and colon cancer. She agrees to continue to take prescription Vitamin D 50,000 IU every week and will follow-up for routine testing of Vitamin D, at least 2-3 times per year to avoid over-replacement.  3. Obesity with current BMI of 55.7  Michelle Pearson is currently in the action stage of change. As such, her goal is to continue with weight loss efforts. She has agreed to the Category 4 Plan.   Exercise goals: All adults should avoid  inactivity. Some physical activity is better than none, and adults who participate in any amount of physical activity gain some health benefits.  Behavioral modification strategies: increasing lean protein intake, meal planning and cooking strategies, and keeping healthy foods in the home.  Michelle Pearson has agreed to follow-up with our clinic in 4 weeks. She was informed of the importance of frequent follow-up visits to maximize her success with intensive lifestyle modifications for her multiple health conditions.   Objective:   Blood pressure 140/75, pulse 78, temperature 98.1 F (36.7 C), height (P) 5\' 2"  (1.575 m), weight (!) (P) 304 lb (137.9 kg), SpO2 99 %. Body mass index is 55.6 kg/m (pended).  General: Cooperative, alert, well developed, in no acute distress. HEENT: Conjunctivae and lids unremarkable. Cardiovascular: Regular rhythm.  Lungs: Normal work of breathing. Neurologic: No focal deficits.   Lab Results  Component Value Date   CREATININE 1.20 (H) 10/13/2020   BUN 15 10/13/2020   NA 138 10/13/2020   K 4.8 10/13/2020   CL 98 10/13/2020   CO2 23 10/13/2020   Lab Results  Component Value Date   ALT 17 10/13/2020   AST 20 10/13/2020   ALKPHOS 100 10/13/2020   BILITOT 0.3 10/13/2020   No results found for: HGBA1C Lab Results  Component Value Date   INSULIN 33.0 (H) 10/13/2020   Lab Results  Component Value Date   TSH 3.890 10/13/2020   No results found for: CHOL, HDL, LDLCALC, LDLDIRECT, TRIG, CHOLHDL Lab Results  Component Value Date   VD25OH 9.1 (  L) 10/13/2020   Lab Results  Component Value Date   WBC 9.8 10/02/2017   HGB 11.6 (L) 10/02/2017   HCT 34.8 (L) 10/02/2017   MCV 85.1 10/02/2017   PLT 315 10/02/2017    Attestation Statements:   Reviewed by clinician on day of visit: allergies, medications, problem list, medical history, surgical history, family history, social history, and previous encounter notes.  Time spent on visit including pre-visit  chart review and post-visit care and charting was 12 minutes.   Edmund Hilda, CMA, am acting as transcriptionist for Reuben Likes, MD.   I have reviewed the above documentation for accuracy and completeness, and I agree with the above. - Reuben Likes, MD

## 2021-04-07 DIAGNOSIS — E1169 Type 2 diabetes mellitus with other specified complication: Secondary | ICD-10-CM | POA: Diagnosis not present

## 2021-04-07 DIAGNOSIS — E7800 Pure hypercholesterolemia, unspecified: Secondary | ICD-10-CM | POA: Diagnosis not present

## 2021-04-07 DIAGNOSIS — Z7984 Long term (current) use of oral hypoglycemic drugs: Secondary | ICD-10-CM | POA: Diagnosis not present

## 2021-04-07 DIAGNOSIS — I10 Essential (primary) hypertension: Secondary | ICD-10-CM | POA: Diagnosis not present

## 2021-04-27 ENCOUNTER — Ambulatory Visit (INDEPENDENT_AMBULATORY_CARE_PROVIDER_SITE_OTHER): Payer: Medicare Other | Admitting: Family Medicine

## 2021-05-13 ENCOUNTER — Other Ambulatory Visit: Payer: Self-pay | Admitting: Nephrology

## 2021-05-13 DIAGNOSIS — N281 Cyst of kidney, acquired: Secondary | ICD-10-CM

## 2021-05-25 ENCOUNTER — Encounter (INDEPENDENT_AMBULATORY_CARE_PROVIDER_SITE_OTHER): Payer: Self-pay | Admitting: Bariatrics

## 2021-05-25 ENCOUNTER — Ambulatory Visit (INDEPENDENT_AMBULATORY_CARE_PROVIDER_SITE_OTHER): Payer: Medicare Other | Admitting: Bariatrics

## 2021-05-25 ENCOUNTER — Other Ambulatory Visit: Payer: Self-pay

## 2021-05-25 VITALS — HR 87 | Temp 98.0°F | Ht 62.0 in | Wt 303.0 lb

## 2021-05-25 DIAGNOSIS — I152 Hypertension secondary to endocrine disorders: Secondary | ICD-10-CM | POA: Diagnosis not present

## 2021-05-25 DIAGNOSIS — E559 Vitamin D deficiency, unspecified: Secondary | ICD-10-CM | POA: Diagnosis not present

## 2021-05-25 DIAGNOSIS — E1159 Type 2 diabetes mellitus with other circulatory complications: Secondary | ICD-10-CM | POA: Diagnosis not present

## 2021-05-25 DIAGNOSIS — Z6841 Body Mass Index (BMI) 40.0 and over, adult: Secondary | ICD-10-CM | POA: Diagnosis not present

## 2021-05-25 MED ORDER — VITAMIN D (ERGOCALCIFEROL) 1.25 MG (50000 UNIT) PO CAPS: 50000.0000 [IU] | ORAL_CAPSULE | ORAL | 0 refills | Status: DC

## 2021-05-26 ENCOUNTER — Encounter (INDEPENDENT_AMBULATORY_CARE_PROVIDER_SITE_OTHER): Payer: Self-pay | Admitting: Bariatrics

## 2021-05-26 DIAGNOSIS — E7800 Pure hypercholesterolemia, unspecified: Secondary | ICD-10-CM | POA: Insufficient documentation

## 2021-05-26 DIAGNOSIS — E1169 Type 2 diabetes mellitus with other specified complication: Secondary | ICD-10-CM | POA: Insufficient documentation

## 2021-05-26 DIAGNOSIS — K869 Disease of pancreas, unspecified: Secondary | ICD-10-CM | POA: Insufficient documentation

## 2021-05-26 NOTE — Progress Notes (Signed)
Chief Complaint:   OBESITY Michelle Pearson is here to discuss her progress with her obesity treatment plan along with follow-up of her obesity related diagnoses. Michelle Pearson is on the Category 4 Plan and states she is following her eating plan approximately 75% of the time. Michelle Pearson states she is walking for 20 minutes 5 times per week and using bands for 22 minutes 3 times per week.  Today's visit was #: 9 Starting weight: 334 lbs Starting date: 10/13/2020 Today's weight: 303 lbs Today's date: 05/25/2021 Total lbs lost to date: 31 lbs Total lbs lost since last in-office visit: 4 lbs  Interim History: Michelle Pearson is down an additional 4 lbs since her last visit. She is doing well with her water and protein.   Subjective:   1. Vitamin D deficiency Michelle Pearson agrees to start prescription Vitamin D.  2. Hypertension associated with diabetes (HCC) Michelle Pearson's blood pressure is controlled.   Assessment/Plan:   1. Vitamin D deficiency Low Vitamin D level contributes to fatigue and are associated with obesity, breast, and colon cancer. Michelle Pearson agrees to start prescription Vitamin D 50,000 IU with no refills every week and she will follow-up for routine testing of Vitamin D, at least 2-3 times per year to avoid over-replacement.   - Vitamin D, Ergocalciferol, (DRISDOL) 1.25 MG (50000 UNIT) CAPS capsule; Take 1 capsule (50,000 Units total) by mouth every 7 (seven) days.  Dispense: 8 capsule; Refill: 0  2. Hypertension associated with diabetes (HCC) Michelle Pearson will continue her medication. She is working on healthy weight loss and exercise to improve blood pressure control. We will watch for signs of hypotension as she continues her lifestyle modifications.  3. Obesity with current BMI of 55.4 Michelle Pearson is currently in the action stage of change. As such, her goal is to continue with weight loss efforts. She has agreed to the Category 4 Plan.   Michelle Pearson will continue meal planning. Strategies for the holidays were  provided.   Exercise goals:  As is.  Behavioral modification strategies: increasing lean protein intake, decreasing simple carbohydrates, increasing vegetables, increasing water intake, decreasing eating out, no skipping meals, meal planning and cooking strategies, keeping healthy foods in the home, and planning for success.  Michelle Pearson has agreed to follow-up with our clinic in 3 weeks with Dr. Lawson Radar or William Hamburger, NP. She was informed of the importance of frequent follow-up visits to maximize her success with intensive lifestyle modifications for her multiple health conditions.   Objective:   Pulse 87, temperature 98 F (36.7 C), height 5\' 2"  (1.575 m), weight (!) 303 lb (137.4 kg), SpO2 98 %. Body mass index is 55.42 kg/m.  General: Cooperative, alert, well developed, in no acute distress. HEENT: Conjunctivae and lids unremarkable. Cardiovascular: Regular rhythm.  Lungs: Normal work of breathing. Neurologic: No focal deficits.   Lab Results  Component Value Date   CREATININE 1.20 (H) 10/13/2020   BUN 15 10/13/2020   NA 138 10/13/2020   K 4.8 10/13/2020   CL 98 10/13/2020   CO2 23 10/13/2020   Lab Results  Component Value Date   ALT 17 10/13/2020   AST 20 10/13/2020   ALKPHOS 100 10/13/2020   BILITOT 0.3 10/13/2020   No results found for: HGBA1C Lab Results  Component Value Date   INSULIN 33.0 (H) 10/13/2020   Lab Results  Component Value Date   TSH 3.890 10/13/2020   No results found for: CHOL, HDL, LDLCALC, LDLDIRECT, TRIG, CHOLHDL Lab Results  Component Value Date  VD25OH 9.1 (L) 10/13/2020   Lab Results  Component Value Date   WBC 9.8 10/02/2017   HGB 11.6 (L) 10/02/2017   HCT 34.8 (L) 10/02/2017   MCV 85.1 10/02/2017   PLT 315 10/02/2017   No results found for: IRON, TIBC, FERRITIN  Obesity Behavioral Intervention:   Approximately 15 minutes were spent on the discussion below.  ASK: We discussed the diagnosis of obesity with Reizel today and  Zoiee agreed to give [place] permission to discuss obesity behavioral modification therapy today.  ASSESS: Suzane has the diagnosis of obesity and her BMI today is 55.4. Almira is in the action stage of change.   ADVISE: Smantha was educated on the multiple health risks of obesity as well as the benefit of weight loss to improve her health. She was advised of the need for long term treatment and the importance of lifestyle modifications to improve her current health and to decrease her risk of future health problems.  AGREE: Multiple dietary modification options and treatment options were discussed and Lener agreed to follow the recommendations documented in the above note.  ARRANGE: Desyre was educated on the importance of frequent visits to treat obesity as outlined per CMS and USPSTF guidelines and agreed to schedule her next follow up appointment today.  Attestation Statements:   Reviewed by clinician on day of visit: allergies, medications, problem list, medical history, surgical history, family history, social history, and previous encounter notes.  I, Jackson Latino, RMA, am acting as Energy manager for [employer], DO.   I have reviewed the above documentation for accuracy and completeness, and I agree with the above. Corinna Capra, DO

## 2021-05-27 ENCOUNTER — Ambulatory Visit
Admission: RE | Admit: 2021-05-27 | Discharge: 2021-05-27 | Disposition: A | Discharge status: Home / Self Care | Payer: Medicare Other | Source: Ambulatory Visit | Attending: Nephrology | Admitting: Nephrology

## 2021-05-27 DIAGNOSIS — N281 Cyst of kidney, acquired: Secondary | ICD-10-CM | POA: Diagnosis not present

## 2021-06-22 ENCOUNTER — Encounter (INDEPENDENT_AMBULATORY_CARE_PROVIDER_SITE_OTHER): Payer: Self-pay | Admitting: Family Medicine

## 2021-06-22 ENCOUNTER — Ambulatory Visit (INDEPENDENT_AMBULATORY_CARE_PROVIDER_SITE_OTHER): Payer: Medicare Other | Admitting: Family Medicine

## 2021-06-22 ENCOUNTER — Other Ambulatory Visit: Payer: Self-pay

## 2021-06-22 VITALS — BP 136/78 | HR 83 | Temp 98.0°F | Ht 62.0 in | Wt 299.0 lb

## 2021-06-22 DIAGNOSIS — E559 Vitamin D deficiency, unspecified: Secondary | ICD-10-CM

## 2021-06-22 DIAGNOSIS — Z6841 Body Mass Index (BMI) 40.0 and over, adult: Secondary | ICD-10-CM | POA: Diagnosis not present

## 2021-06-22 DIAGNOSIS — I10 Essential (primary) hypertension: Secondary | ICD-10-CM

## 2021-06-22 MED ORDER — VITAMIN D (ERGOCALCIFEROL) 1.25 MG (50000 UNIT) PO CAPS
50000.0000 [IU] | ORAL_CAPSULE | ORAL | 0 refills | Status: DC
Start: 2021-06-22 — End: 2021-08-31

## 2021-06-22 NOTE — Progress Notes (Signed)
Chief Complaint:   OBESITY Michelle Pearson is here to discuss her progress with her obesity treatment plan along with follow-up of her obesity related diagnoses. Michelle Pearson is on the Category 4 Plan and states she is following her eating plan approximately 70% of the time. Michelle Pearson states she is using bands and treadmill 20 minutes 3 times per week.  Today's visit was #: 10 Starting weight: 334 lbs Starting date: 10/13/2020 Today's weight: 299 lbs Today's date: 06/22/2021 Total lbs lost to date: 35 Total lbs lost since last in-office visit: 4  Interim History: Michelle Pearson had a nice Thanksgiving. She did have an indulgent day and then got back on meal plan. Pt denies hunger. She is going to be doing some shopping and visiting over the next few weeks. Pt did have some questions about meal plan categories.  Subjective:   1. Vitamin D deficiency Pt's last Vit D level was 9.1. She is on prescription Vit D.  2. Essential hypertension BP well controlled. Pt denies chest pain/chest pressure/headache.  Assessment/Plan:   1. Vitamin D deficiency Low Vitamin D level contributes to fatigue and are associated with obesity, breast, and colon cancer. She agrees to continue to take prescription Vitamin D 50,000 IU every week and will follow-up for routine testing of Vitamin D, at least 2-3 times per year to avoid over-replacement.  Refill- Vitamin D, Ergocalciferol, (DRISDOL) 1.25 MG (50000 UNIT) CAPS capsule; Take 1 capsule (50,000 Units total) by mouth every 7 (seven) days.  Dispense: 8 capsule; Refill: 0  2. Essential hypertension [place]. Aeisha is working on healthy weight loss and exercise to improve blood pressure control. We will watch for signs of hypotension as she continues her lifestyle modifications. Continue current treatment plan. If BP stays controlled, we will start decreasing meds.  3. Obesity with current BMI of 54.8  Michelle Pearson is currently in the action stage of change. As such, her goal is to  continue with weight loss efforts. She has agreed to the Category 4 Plan.   Exercise goals:  As is  Behavioral modification strategies: increasing lean protein intake, meal planning and cooking strategies, holiday eating strategies , and planning for success.  Michelle Pearson has agreed to follow-up with our clinic in 3-4 weeks- fasting. She was informed of the importance of frequent follow-up visits to maximize her success with intensive lifestyle modifications for her multiple health conditions.   Objective:   Blood pressure 136/78, pulse 83, temperature 98 F (36.7 C), height 5\' 2"  (1.575 m), weight 299 lb (135.6 kg), SpO2 100 %. Body mass index is 54.69 kg/m.  General: Cooperative, alert, well developed, in no acute distress. HEENT: Conjunctivae and lids unremarkable. Cardiovascular: Regular rhythm.  Lungs: Normal work of breathing. Neurologic: No focal deficits.   Lab Results  Component Value Date   CREATININE 1.20 (H) 10/13/2020   BUN 15 10/13/2020   NA 138 10/13/2020   K 4.8 10/13/2020   CL 98 10/13/2020   CO2 23 10/13/2020   Lab Results  Component Value Date   ALT 17 10/13/2020   AST 20 10/13/2020   ALKPHOS 100 10/13/2020   BILITOT 0.3 10/13/2020   No results found for: HGBA1C Lab Results  Component Value Date   INSULIN 33.0 (H) 10/13/2020   Lab Results  Component Value Date   TSH 3.890 10/13/2020   No results found for: CHOL, HDL, LDLCALC, LDLDIRECT, TRIG, CHOLHDL Lab Results  Component Value Date   VD25OH 9.1 (L) 10/13/2020   Lab Results  Component Value Date   WBC 9.8 10/02/2017   HGB 11.6 (L) 10/02/2017   HCT 34.8 (L) 10/02/2017   MCV 85.1 10/02/2017   PLT 315 10/02/2017    Attestation Statements:   Reviewed by clinician on day of visit: allergies, medications, problem list, medical history, surgical history, family history, social history, and previous encounter notes.  Edmund Hilda, CMA, am acting as transcriptionist for Reuben Likes,  MD.   I have reviewed the above documentation for accuracy and completeness, and I agree with the above. - Reuben Likes, MD

## 2021-07-04 IMAGING — US US RENAL
1 series · 14 of 25 positions shown · non-contrast
Comparison: None.

CLINICAL DATA: Chronic renal disease

EXAM:
RENAL / URINARY TRACT ULTRASOUND COMPLETE

[Series 1: us renal · 0.28mm/px · 14 of 35 slices shown]
[im 1/35]
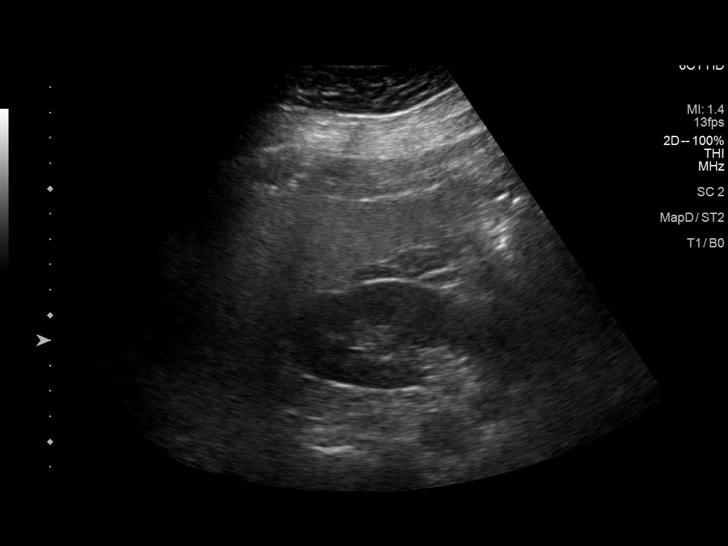
[im 3/35]
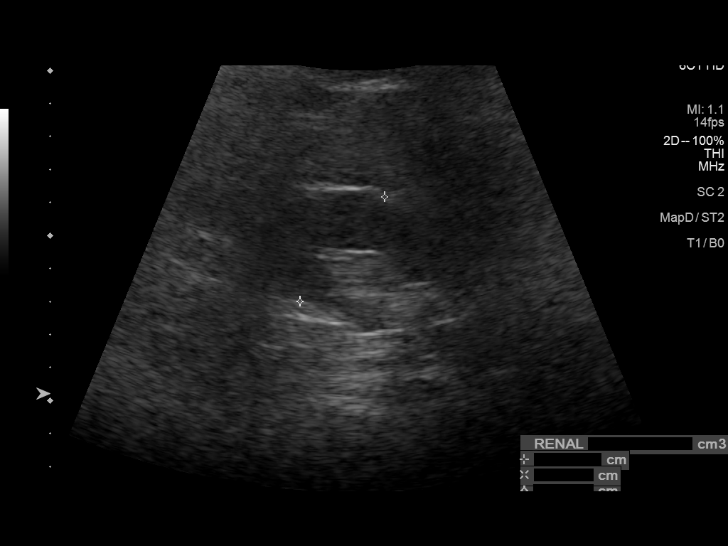
[im 6/35]
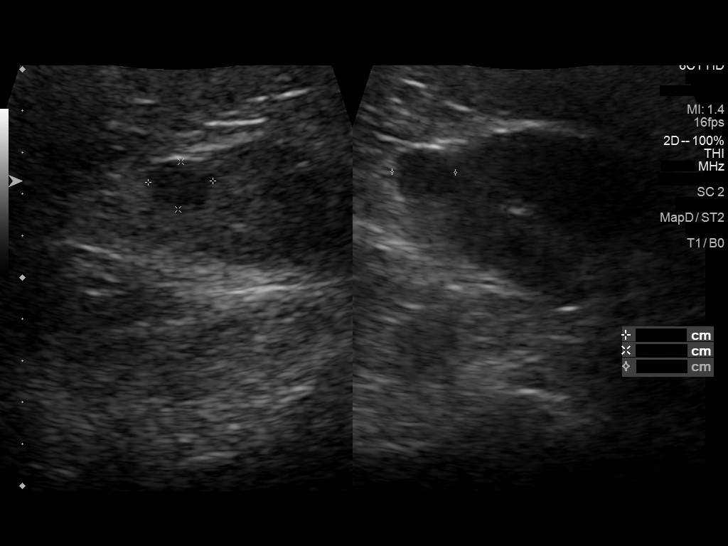
[im 9/35]
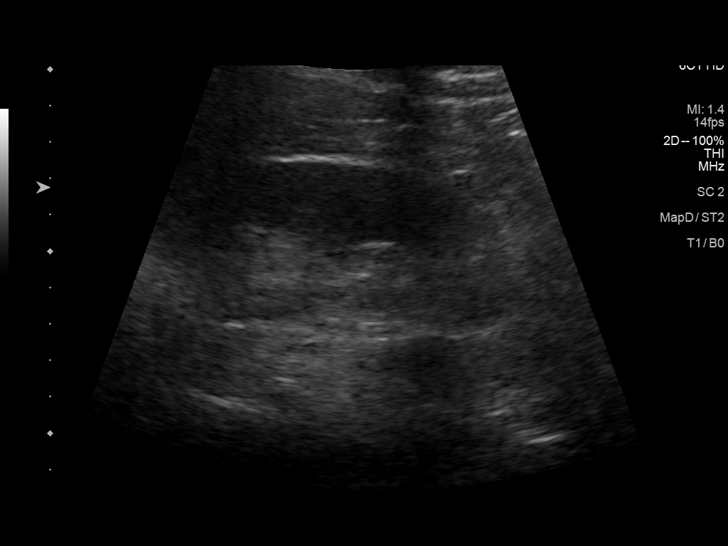
[im 12/35]
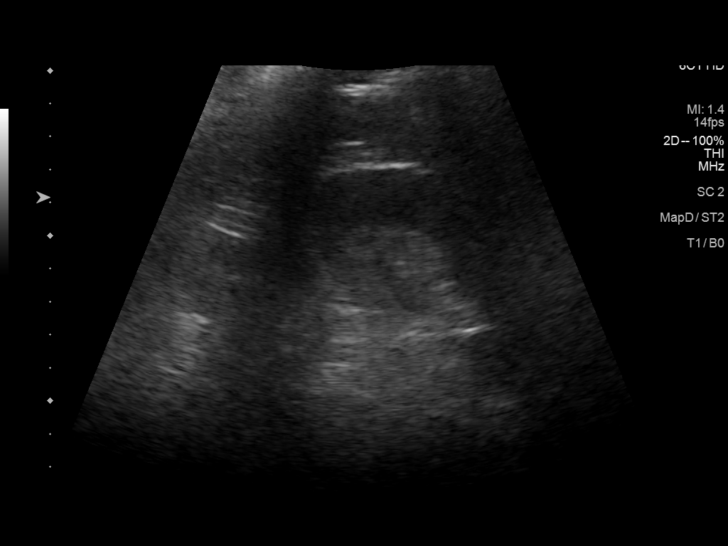
[im 13/35]
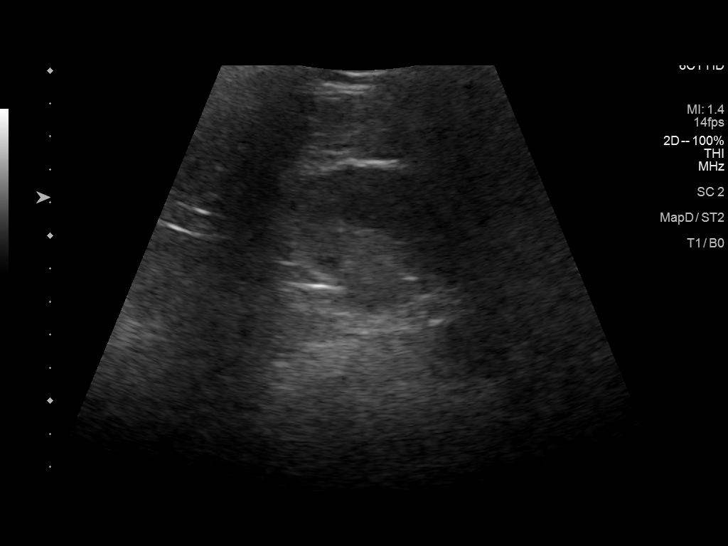
[im 16/35]
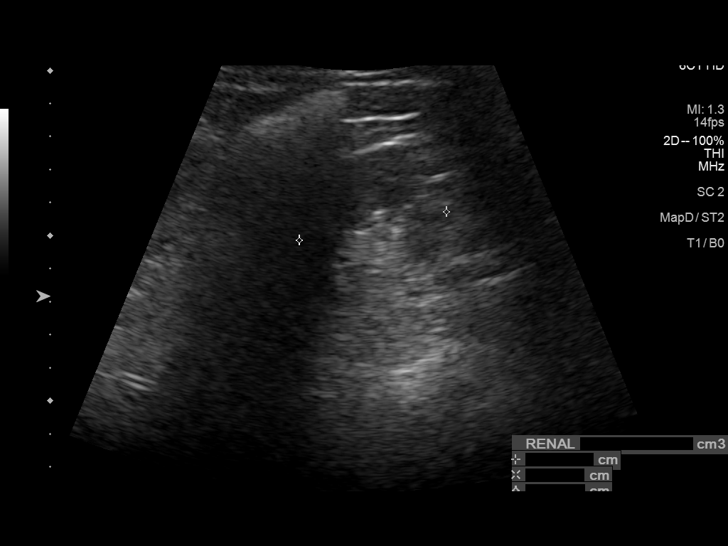
[im 19/35]
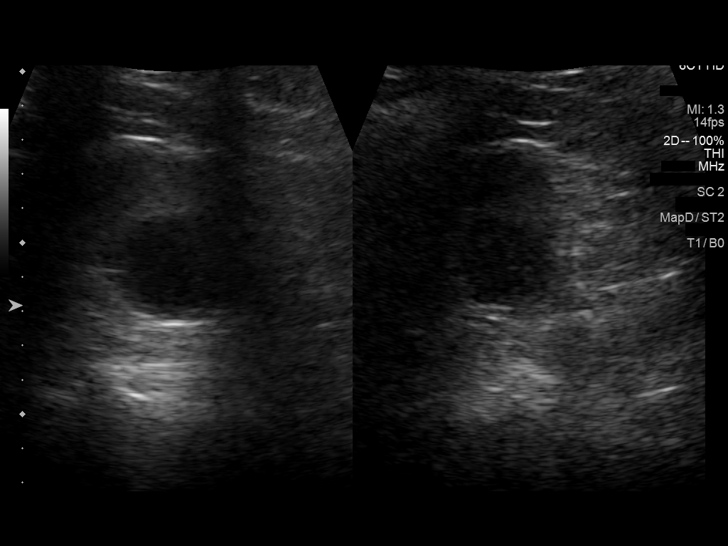
[im 22/35]
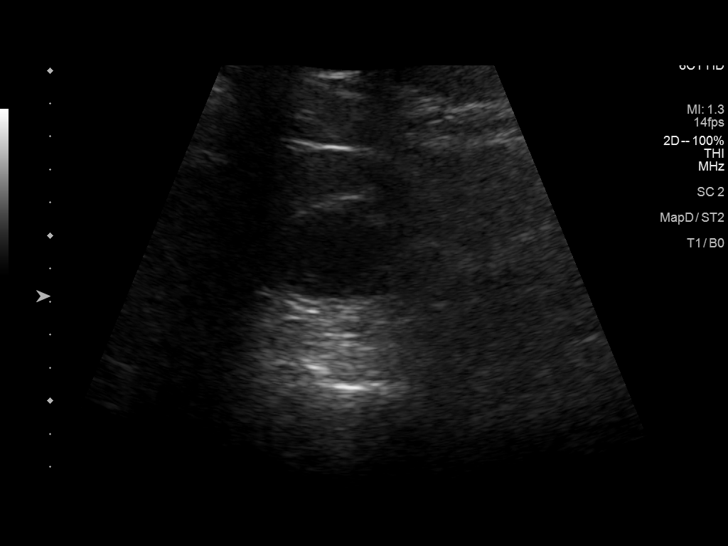
[im 23/35]
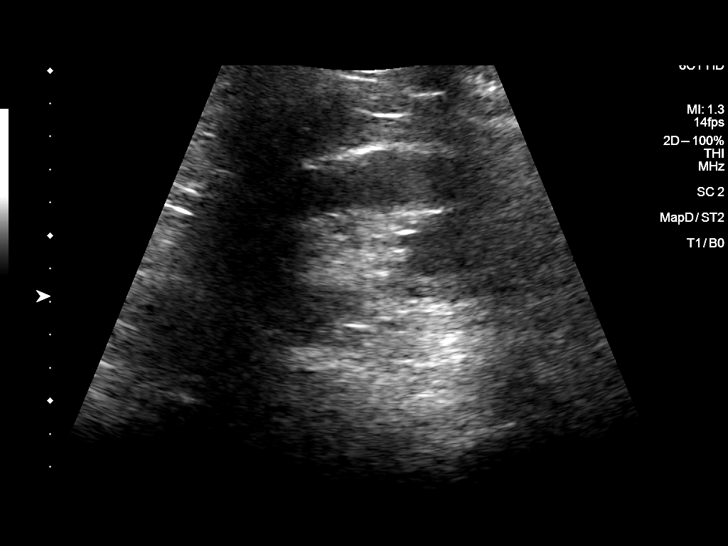
[im 26/35]
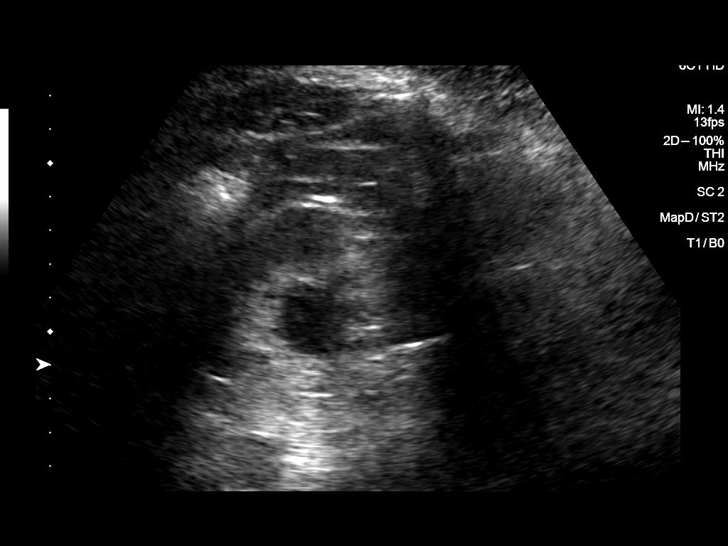
[im 29/35]
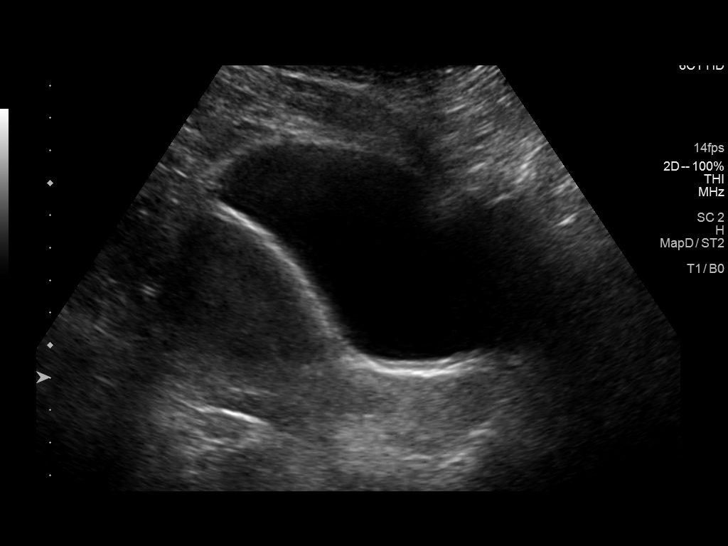
[im 32/35]
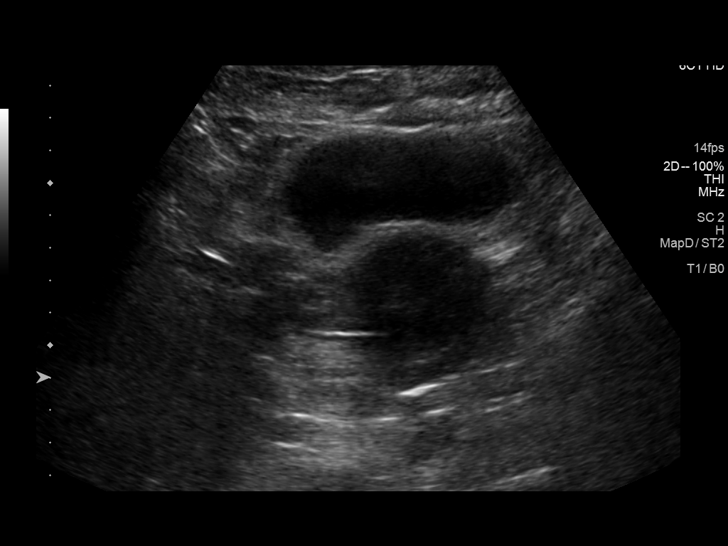
[im 35/35]
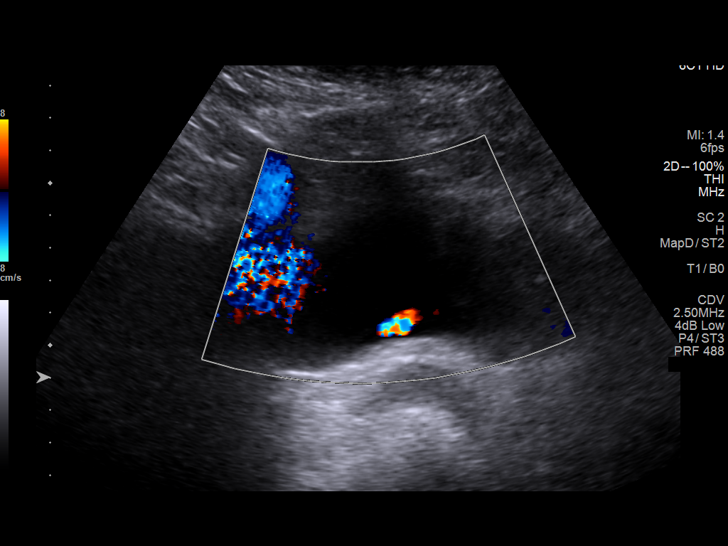

[14 of 25 positions shown; findings below may reference images not displayed]

FINDINGS: Right Kidney:

Renal measurements: 11.7 x 3.7 x 4.1 cm = volume: 91 mL. Contains a
1.6 cm mass in the upper pole which is nonspecific on today's
imaging.

Left Kidney:

Renal measurements: 11.0 x 5.1 x 4.5 cm = volume: 133 mL. Contains a
3.2 cm cyst in the lower pole of the left kidney, better assessed on
the July 31, 2013 ultrasound comparison.

Bladder:

Appears normal for degree of bladder distention.

Other:

None.
IMPRESSION: 1. Evaluation is limited due to patient body habitus.
2. There is a 1.6 cm mass in the upper pole of the right kidney.
This is nonspecific on today's imaging but favored to represent a
cyst. This could be followed with ultrasound to ensure stability or
more definitively characterized with MRI.
3. There is a 3.2 cm cyst in the lower pole of the left kidney.

## 2021-07-22 ENCOUNTER — Ambulatory Visit (INDEPENDENT_AMBULATORY_CARE_PROVIDER_SITE_OTHER): Payer: Medicare Other | Admitting: Family Medicine

## 2021-07-22 ENCOUNTER — Other Ambulatory Visit: Payer: Self-pay

## 2021-07-22 ENCOUNTER — Encounter (INDEPENDENT_AMBULATORY_CARE_PROVIDER_SITE_OTHER): Payer: Self-pay | Admitting: Family Medicine

## 2021-07-22 VITALS — BP 129/78 | HR 94 | Temp 98.1°F | Ht 62.0 in | Wt 301.0 lb

## 2021-07-22 DIAGNOSIS — E559 Vitamin D deficiency, unspecified: Secondary | ICD-10-CM

## 2021-07-22 DIAGNOSIS — E1159 Type 2 diabetes mellitus with other circulatory complications: Secondary | ICD-10-CM | POA: Diagnosis not present

## 2021-07-22 DIAGNOSIS — E1169 Type 2 diabetes mellitus with other specified complication: Secondary | ICD-10-CM

## 2021-07-22 DIAGNOSIS — Z6841 Body Mass Index (BMI) 40.0 and over, adult: Secondary | ICD-10-CM

## 2021-07-22 DIAGNOSIS — E785 Hyperlipidemia, unspecified: Secondary | ICD-10-CM | POA: Diagnosis not present

## 2021-07-22 DIAGNOSIS — I152 Hypertension secondary to endocrine disorders: Secondary | ICD-10-CM | POA: Diagnosis not present

## 2021-07-23 LAB — COMPREHENSIVE METABOLIC PANEL
ALT: 15 IU/L (ref 0–32)
AST: 16 IU/L (ref 0–40)
Albumin/Globulin Ratio: 1.3 (ref 1.2–2.2)
Albumin: 4.3 g/dL (ref 3.8–4.8)
Alkaline Phosphatase: 92 IU/L (ref 44–121)
BUN/Creatinine Ratio: 20 (ref 12–28)
BUN: 23 mg/dL (ref 8–27)
Bilirubin Total: 0.3 mg/dL (ref 0.0–1.2)
CO2: 22 mmol/L (ref 20–29)
Calcium: 10.1 mg/dL (ref 8.7–10.3)
Chloride: 104 mmol/L (ref 96–106)
Creatinine, Ser: 1.13 mg/dL — ABNORMAL HIGH (ref 0.57–1.00)
Globulin, Total: 3.3 g/dL (ref 1.5–4.5)
Glucose: 84 mg/dL (ref 70–99)
Potassium: 4.8 mmol/L (ref 3.5–5.2)
Sodium: 142 mmol/L (ref 134–144)
Total Protein: 7.6 g/dL (ref 6.0–8.5)
eGFR: 55 mL/min/{1.73_m2} — ABNORMAL LOW (ref >59)

## 2021-07-23 LAB — INSULIN, RANDOM: INSULIN: 25 u[IU]/mL — ABNORMAL HIGH (ref 2.6–24.9)

## 2021-07-23 LAB — VITAMIN D 25 HYDROXY (VIT D DEFICIENCY, FRACTURES): Vit D, 25-Hydroxy: 40.1 ng/mL (ref 30.0–100.0)

## 2021-07-23 LAB — LIPID PANEL WITH LDL/HDL RATIO
Cholesterol, Total: 128 mg/dL (ref 100–199)
HDL: 58 mg/dL (ref >39)
LDL Chol Calc (NIH): 53 mg/dL (ref 0–99)
LDL/HDL Ratio: 0.9 ratio (ref 0.0–3.2)
Triglycerides: 89 mg/dL (ref 0–149)
VLDL Cholesterol Cal: 17 mg/dL (ref 5–40)

## 2021-07-23 LAB — HEMOGLOBIN A1C
Est. average glucose Bld gHb Est-mCnc: 120 mg/dL
Hgb A1c MFr Bld: 5.8 % — ABNORMAL HIGH (ref 4.8–5.6)

## 2021-07-26 NOTE — Progress Notes (Signed)
Chief Complaint:   OBESITY Michelle Pearson is here to discuss her progress with her obesity treatment plan along with follow-up of her obesity related diagnoses. Michelle Pearson is on the Category 4 Plan and states she is following her eating plan approximately 70% of the time. Michelle Pearson states she is using resistance bands for 25 minutes 3 times per week.  Today's visit was #: 11 Starting weight: 334 lbs Starting date: 10/13/2020 Today's weight: 301 lbs Today's date: 07/22/2021 Total lbs lost to date: 33 lbs Total lbs lost since last in-office visit: 0  Interim History: Michelle Pearson had a few get-togethers with family and friends for the holidays and her 39th anniversary.  She got back on plan after her anniversary.  Over the next few weeks, she does not have much planned.  She does not need any changes to the meal plan.  Subjective:   1. Vitamin D deficiency She denies nausea, vomiting, and muscle weakness, but endorses fatigue.  She is on prescription vitamin D.  2. Hypertension associated with diabetes (HCC) BP is controlled today.  She denies chest pain, chest pressure, headache.  On lisinopril and HCTZ.  3. Type 2 diabetes mellitus with other specified complication, without long-term current use of insulin (HCC) Michelle Pearson is on Glucophage.  4. Hyperlipidemia associated with type 2 diabetes mellitus (HCC) She is on a statin.  No transaminitis.  Assessment/Plan:   1. Vitamin D deficiency Low Vitamin D level contributes to fatigue and are associated with obesity, breast, and colon cancer.  Will check her vitamin D level today.  - VITAMIN D 25 Hydroxy (Vit-D Deficiency, Fractures)  2. Hypertension associated with diabetes (HCC) Michelle Pearson is working on healthy weight loss and exercise to improve blood pressure control. We will watch for signs of hypotension as she continues her lifestyle modifications.  Will check CMP today.  - Comprehensive metabolic panel  3. Type 2 diabetes mellitus with other  specified complication, without long-term current use of insulin (HCC) Good blood sugar control is important to decrease the likelihood of diabetic complications such as nephropathy, neuropathy, limb loss, blindness, coronary artery disease, and death. Intensive lifestyle modification including diet, exercise and weight loss are the first line of treatment for diabetes. Will check A1c and insulin level today.  - Hemoglobin A1c - Insulin, random  4. Hyperlipidemia associated with type 2 diabetes mellitus (HCC) Cardiovascular risk and specific lipid/LDL goals reviewed.  We discussed several lifestyle modifications today and Michelle Pearson will continue to work on diet, exercise and weight loss efforts. Will check FLP today.  Counseling Intensive lifestyle modifications are the first line treatment for this issue. Dietary changes: Increase soluble fiber. Decrease simple carbohydrates. Exercise changes: Moderate to vigorous-intensity aerobic activity 150 minutes per week if tolerated. Lipid-lowering medications: see documented in medical record.  - Lipid Panel With LDL/HDL Ratio  5. Obesity with current BMI of 55.2  Michelle Pearson is currently in the action stage of change. As such, her goal is to continue with weight loss efforts. She has agreed to the Category 4 Plan and keeping a food journal and adhering to recommended goals of 550-700 calories and 50+ grams of protein at supper.   Exercise goals:  As is.  Behavioral modification strategies: increasing lean protein intake, decreasing simple carbohydrates, meal planning and cooking strategies, and keeping healthy foods in the home.  Michelle Pearson has agreed to follow-up with our clinic in 4 weeks. She was informed of the importance of frequent follow-up visits to maximize her success with intensive lifestyle  modifications for her multiple health conditions.   Michelle Pearson was informed we would discuss her lab results at her next visit unless there is a critical issue  that needs to be addressed sooner. Michelle Pearson agreed to keep her next visit at the agreed upon time to discuss these results.  Objective:   Blood pressure 129/78, pulse 94, temperature 98.1 F (36.7 C), height 5\' 2"  (1.575 m), weight (!) 301 lb (136.5 kg), SpO2 98 %. Body mass index is 55.05 kg/m.  General: Cooperative, alert, well developed, in no acute distress. HEENT: Conjunctivae and lids unremarkable. Cardiovascular: Regular rhythm.  Lungs: Normal work of breathing. Neurologic: No focal deficits.   Lab Results  Component Value Date   CREATININE 1.13 (H) 07/22/2021   BUN 23 07/22/2021   NA 142 07/22/2021   K 4.8 07/22/2021   CL 104 07/22/2021   CO2 22 07/22/2021   Lab Results  Component Value Date   ALT 15 07/22/2021   AST 16 07/22/2021   ALKPHOS 92 07/22/2021   BILITOT 0.3 07/22/2021   Lab Results  Component Value Date   HGBA1C 5.8 (H) 07/22/2021   Lab Results  Component Value Date   INSULIN 25.0 (H) 07/22/2021   INSULIN 33.0 (H) 10/13/2020   Lab Results  Component Value Date   TSH 3.890 10/13/2020   Lab Results  Component Value Date   CHOL 128 07/22/2021   HDL 58 07/22/2021   LDLCALC 53 07/22/2021   TRIG 89 07/22/2021   Lab Results  Component Value Date   VD25OH 40.1 07/22/2021   VD25OH 9.1 (L) 10/13/2020   Lab Results  Component Value Date   WBC 9.8 10/02/2017   HGB 11.6 (L) 10/02/2017   HCT 34.8 (L) 10/02/2017   MCV 85.1 10/02/2017   PLT 315 10/02/2017   Obesity Behavioral Intervention:   Approximately 15 minutes were spent on the discussion below.  ASK: We discussed the diagnosis of obesity with Michelle Pearson today and Michelle Pearson agreed to give 10/04/2017 permission to discuss obesity behavioral modification therapy today.  ASSESS: Michelle Pearson has the diagnosis of obesity and her BMI today is 55.2. Michelle Pearson is in the action stage of change.   ADVISE: Michelle Pearson was educated on the multiple health risks of obesity as well as the benefit of weight loss to improve her  health. She was advised of the need for long term treatment and the importance of lifestyle modifications to improve her current health and to decrease her risk of future health problems.  AGREE: Multiple dietary modification options and treatment options were discussed and Michelle Pearson agreed to follow the recommendations documented in the above note.  ARRANGE: Michelle Pearson was educated on the importance of frequent visits to treat obesity as outlined per CMS and USPSTF guidelines and agreed to schedule her next follow up appointment today.  Attestation Statements:   Reviewed by clinician on day of visit: allergies, medications, problem list, medical history, surgical history, family history, social history, and previous encounter notes.  I, Campbell Lerner, CMA, am acting as transcriptionist for Insurance claims handler, MD.  I have reviewed the above documentation for accuracy and completeness, and I agree with the above. - Reuben Likes, MD

## 2021-08-24 ENCOUNTER — Ambulatory Visit (INDEPENDENT_AMBULATORY_CARE_PROVIDER_SITE_OTHER): Payer: Medicare Other | Admitting: Family Medicine

## 2021-08-31 ENCOUNTER — Ambulatory Visit (INDEPENDENT_AMBULATORY_CARE_PROVIDER_SITE_OTHER): Payer: Medicare Other | Admitting: Family Medicine

## 2021-08-31 ENCOUNTER — Encounter (INDEPENDENT_AMBULATORY_CARE_PROVIDER_SITE_OTHER): Payer: Self-pay | Admitting: Family Medicine

## 2021-08-31 ENCOUNTER — Other Ambulatory Visit: Payer: Self-pay

## 2021-08-31 VITALS — BP 123/78 | HR 77 | Temp 98.2°F | Ht 62.0 in | Wt 298.0 lb

## 2021-08-31 DIAGNOSIS — Z6841 Body Mass Index (BMI) 40.0 and over, adult: Secondary | ICD-10-CM

## 2021-08-31 DIAGNOSIS — E559 Vitamin D deficiency, unspecified: Secondary | ICD-10-CM | POA: Diagnosis not present

## 2021-08-31 DIAGNOSIS — I152 Hypertension secondary to endocrine disorders: Secondary | ICD-10-CM | POA: Diagnosis not present

## 2021-08-31 DIAGNOSIS — E1165 Type 2 diabetes mellitus with hyperglycemia: Secondary | ICD-10-CM | POA: Diagnosis not present

## 2021-08-31 DIAGNOSIS — E669 Obesity, unspecified: Secondary | ICD-10-CM

## 2021-08-31 DIAGNOSIS — E1169 Type 2 diabetes mellitus with other specified complication: Secondary | ICD-10-CM | POA: Diagnosis not present

## 2021-08-31 DIAGNOSIS — E785 Hyperlipidemia, unspecified: Secondary | ICD-10-CM

## 2021-08-31 DIAGNOSIS — E1159 Type 2 diabetes mellitus with other circulatory complications: Secondary | ICD-10-CM

## 2021-08-31 DIAGNOSIS — Z7984 Long term (current) use of oral hypoglycemic drugs: Secondary | ICD-10-CM

## 2021-08-31 MED ORDER — VITAMIN D (ERGOCALCIFEROL) 1.25 MG (50000 UNIT) PO CAPS: 50000.0000 [IU] | ORAL_CAPSULE | ORAL | 0 refills | Status: DC

## 2021-09-01 NOTE — Progress Notes (Signed)
Chief Complaint:   OBESITY Michelle Pearson is here to discuss her progress with her obesity treatment plan along with follow-up of her obesity related diagnoses. Michelle Pearson is on the Category 4 Plan and states she is following her eating plan approximately 70% of the time. Michelle Pearson states she is walking on treadmill and using resistance bands 25-30 minutes 3-5 times per week.  Today's visit was #: 12 Starting weight: 334 lbs Starting date: 10/13/2020 Today's weight: 298 lbs Today's date: 08/31/2021 Total lbs lost to date: 36 Total lbs lost since last in-office visit: 3  Interim History: Michelle Pearson has been sticking with category 4. She had blood work done at her last appt. She denies obstacles to category 4. Pt is getting in anywhere between 8-10 oz protein for dinner. For snack calories, she is doing fruit, chips, yogurt, or popcorn. No obstacles for the next few weeks.  Subjective:   1. Vitamin D deficiency Discussed labs with patient today. Michelle Pearson denies nausea, vomiting, and muscle weakness but notes fatigue. She is on prescription Vit D.  2. Type 2 diabetes mellitus with hyperglycemia, without long-term current use of insulin ([place]) Discussed labs with patient today. Michelle Pearson last A1c was 5.8 with an insulin level of 25.0.  3. Hyperlipidemia associated with type 2 diabetes mellitus ([place]) Discussed labs with patient today. Michelle Pearson has an LDL of 53, HDL 58, and triglycerides 89.  4. Hypertension associated with diabetes ([place]) BP well controlled. Michelle Pearson denies chest pain/chest pressure/headache.  Assessment/Plan:   1. Vitamin D deficiency Low Vitamin D level contributes to fatigue and are associated with obesity, breast, and colon cancer. She agrees to continue to take prescription Vitamin D 50,000 IU every week and will follow-up for routine testing of Vitamin D, at least 2-3 times per year to avoid over-replacement.  Refill- Vitamin D, Ergocalciferol, (DRISDOL) 1.25 MG (50000 UNIT) CAPS  capsule; Take 1 capsule (50,000 Units total) by mouth every 7 (seven) days.  Dispense: 8 capsule; Refill: 0  2. Type 2 diabetes mellitus with hyperglycemia, without long-term current use of insulin (HCC) Good blood sugar control is important to decrease the likelihood of diabetic complications such as nephropathy, neuropathy, limb loss, blindness, coronary artery disease, and death. Intensive lifestyle modification including diet, exercise and weight loss are the first line of treatment for diabetes. Labs close to goal. Continue Metformin BID.  3. Hyperlipidemia associated with type 2 diabetes mellitus ([place]) Cardiovascular risk and specific lipid/LDL goals reviewed.  We discussed several lifestyle modifications today and Michelle Pearson will continue to work on diet, exercise and weight loss efforts. Orders and follow up as documented in patient record. Labs at goal. Continue Crestor 5 mg daily.  Counseling Intensive lifestyle modifications are the first line treatment for this issue. Dietary changes: Increase soluble fiber. Decrease simple carbohydrates. Exercise changes: Moderate to vigorous-intensity aerobic activity 150 minutes per week if tolerated. Lipid-lowering medications: see documented in medical record.  4. Hypertension associated with diabetes ([place]) Michelle Pearson is working on healthy weight loss and exercise to improve blood pressure control. We will watch for signs of hypotension as she continues her lifestyle modifications. Continue current treatment plan.  5. Obesity with current BMI of 54.6 Michelle Pearson is currently in the action stage of change. As such, her goal is to continue with weight loss efforts. She has agreed to the Category 4 Plan.   Exercise goals:  As is  Behavioral modification strategies: increasing lean protein intake, meal planning and cooking strategies, and keeping healthy foods in the home.  Michelle Pearson has agreed to follow-up with our clinic in 4-5 weeks. She was informed of the  importance of frequent follow-up visits to maximize her success with intensive lifestyle modifications for her multiple health conditions.   Objective:   Blood pressure 123/78, pulse 77, temperature 98.2 F (36.8 C), height 5\' 2"  (1.575 m), weight 298 lb (135.2 kg), SpO2 97 %. Body mass index is 54.5 kg/m.  General: Cooperative, alert, well developed, in no acute distress. HEENT: Conjunctivae and lids unremarkable. Cardiovascular: Regular rhythm.  Lungs: Normal work of breathing. Neurologic: No focal deficits.   Lab Results  Component Value Date   CREATININE 1.13 (H) 07/22/2021   BUN 23 07/22/2021   NA 142 07/22/2021   K 4.8 07/22/2021   CL 104 07/22/2021   CO2 22 07/22/2021   Lab Results  Component Value Date   ALT 15 07/22/2021   AST 16 07/22/2021   ALKPHOS 92 07/22/2021   BILITOT 0.3 07/22/2021   Lab Results  Component Value Date   HGBA1C 5.8 (H) 07/22/2021   Lab Results  Component Value Date   INSULIN 25.0 (H) 07/22/2021   INSULIN 33.0 (H) 10/13/2020   Lab Results  Component Value Date   TSH 3.890 10/13/2020   Lab Results  Component Value Date   CHOL 128 07/22/2021   HDL 58 07/22/2021   LDLCALC 53 07/22/2021   TRIG 89 07/22/2021   Lab Results  Component Value Date   VD25OH 40.1 07/22/2021   VD25OH 9.1 (L) 10/13/2020   Lab Results  Component Value Date   WBC 9.8 10/02/2017   HGB 11.6 (L) 10/02/2017   HCT 34.8 (L) 10/02/2017   MCV 85.1 10/02/2017   PLT 315 10/02/2017   No results found for: IRON, TIBC, FERRITIN  Obesity Behavioral Intervention:   Approximately 15 minutes were spent on the discussion below.  ASK: We discussed the diagnosis of obesity with Michelle Pearson today and Michelle Pearson agreed to give [place] permission to discuss obesity behavioral modification therapy today.  ASSESS: Michelle Pearson has the diagnosis of obesity and her BMI today is 54.6. Michelle Pearson is in the action stage of change.   ADVISE: Michelle Pearson was educated on the multiple health risks of  obesity as well as the benefit of weight loss to improve her health. She was advised of the need for long term treatment and the importance of lifestyle modifications to improve her current health and to decrease her risk of future health problems.  AGREE: Multiple dietary modification options and treatment options were discussed and Michelle Pearson agreed to follow the recommendations documented in the above note.  ARRANGE: Michelle Pearson was educated on the importance of frequent visits to treat obesity as outlined per CMS and USPSTF guidelines and agreed to schedule her next follow up appointment today.  Attestation Statements:   Reviewed by clinician on day of visit: allergies, medications, problem list, medical history, surgical history, family history, social history, and previous encounter notes.  Coral Ceo, CMA, am acting as transcriptionist for Coralie Common, MD.   I have reviewed the above documentation for accuracy and completeness, and I agree with the above. - Coralie Common, MD

## 2021-10-12 ENCOUNTER — Ambulatory Visit (INDEPENDENT_AMBULATORY_CARE_PROVIDER_SITE_OTHER): Payer: Medicare Other | Admitting: Family Medicine

## 2021-10-26 ENCOUNTER — Ambulatory Visit (INDEPENDENT_AMBULATORY_CARE_PROVIDER_SITE_OTHER): Payer: Medicare Other | Admitting: Family Medicine

## 2021-10-26 ENCOUNTER — Encounter (INDEPENDENT_AMBULATORY_CARE_PROVIDER_SITE_OTHER): Payer: Self-pay | Admitting: Family Medicine

## 2021-10-26 VITALS — BP 128/77 | HR 85 | Temp 97.8°F | Ht 62.0 in | Wt 299.0 lb

## 2021-10-26 DIAGNOSIS — R7303 Prediabetes: Secondary | ICD-10-CM | POA: Diagnosis not present

## 2021-10-26 DIAGNOSIS — E669 Obesity, unspecified: Secondary | ICD-10-CM

## 2021-10-26 DIAGNOSIS — E559 Vitamin D deficiency, unspecified: Secondary | ICD-10-CM

## 2021-10-26 DIAGNOSIS — Z6841 Body Mass Index (BMI) 40.0 and over, adult: Secondary | ICD-10-CM | POA: Diagnosis not present

## 2021-10-26 MED ORDER — VITAMIN D (ERGOCALCIFEROL) 1.25 MG (50000 UNIT) PO CAPS: 50000.0000 [IU] | ORAL_CAPSULE | ORAL | 0 refills | Status: AC

## 2021-11-03 NOTE — Progress Notes (Signed)
Chief Complaint:   OBESITY Michelle Pearson is here to discuss her progress with her obesity treatment plan along with follow-up of her obesity related diagnoses. Michelle Pearson is on the Category 4 Plan and states she is following her eating plan approximately 50% of the time. Michelle Pearson states she is using bands for 25 minutes 3 times per week and walking on the treadmill for 20 to 30 minutes 4 times per week.  Today's visit was #: 13 Starting weight: 334 lbs Starting date: 10/13/2020 Today's weight: 299 lbs Today's date: 10/26/2021 Total lbs lost to date: 35 Total lbs lost since last in-office visit: 0  Interim History: Michelle Pearson's husband was in the hospital over the last month and this has led to her being very stressed. Michelle Pearson has experienced more erratic eating and less control of her food choices over the last few weeks. For the next few weeks, Michelle Pearson is hoping to get down to normal.  Subjective:   1. Vitamin D deficiency Michelle Pearson's last vitamin D level on 07/22/21 was 40.1. She is on prescription vitamin D. Michelle Pearson notes fatigue and denies nausea, vomiting, or muscle weakness.  2. Prediabetes Michelle Pearson's last A1c was 5.8 and her Insulin was 25.0. She is on metformin BID and denies GI side effects.  Assessment/Plan:   1. Vitamin D deficiency Low Vitamin D level contributes to fatigue and are associated with obesity, breast, and colon cancer. She agrees to continue to take prescription Vitamin D @50 ,000 IU every week and will follow-up for routine testing of Vitamin D, at least 2-3 times per year to avoid over-replacement.  - Vitamin D, Ergocalciferol, (DRISDOL) 1.25 MG (50000 UNIT) CAPS capsule; Take 1 capsule (50,000 Units total) by mouth every 7 (seven) days.  Dispense: 8 capsule; Refill: 0  2. Prediabetes Michelle Pearson agrees to continue taking metformin. She is going to have labs with her PCP next month. She will continue to work on weight loss, exercise, and decreasing simple carbohydrates to help decrease  the risk of diabetes.   3. Obesity with current BMI of 54.8 Michelle Pearson is currently in the action stage of change. As such, her goal is to continue with weight loss efforts. She has agreed to the Category 4 Plan.   Exercise goals: All adults should avoid inactivity. Some physical activity is better than none, and adults who participate in any amount of physical activity gain some health benefits.  Behavioral modification strategies: increasing lean protein intake, meal planning and cooking strategies, and keeping healthy foods in the home.  Michelle Pearson has agreed to follow-up with our clinic in 5 weeks. She was informed of the importance of frequent follow-up visits to maximize her success with intensive lifestyle modifications for her multiple health conditions.   Objective:   Blood pressure 128/77, pulse 85, temperature 97.8 F (36.6 C), height 5\' 2"  (1.575 m), weight 299 lb (135.6 kg), SpO2 98 %. Body mass index is 54.69 kg/m.  General: Cooperative, alert, well developed, in no acute distress. HEENT: Conjunctivae and lids unremarkable. Cardiovascular: Regular rhythm.  Lungs: Normal work of breathing. Neurologic: No focal deficits.   Lab Results  Component Value Date   CREATININE 1.13 (H) 07/22/2021   BUN 23 07/22/2021   NA 142 07/22/2021   K 4.8 07/22/2021   CL 104 07/22/2021   CO2 22 07/22/2021   Lab Results  Component Value Date   ALT 15 07/22/2021   AST 16 07/22/2021   ALKPHOS 92 07/22/2021   BILITOT 0.3 07/22/2021   Lab Results  Component Value Date   HGBA1C 5.8 (H) 07/22/2021   Lab Results  Component Value Date   INSULIN 25.0 (H) 07/22/2021   INSULIN 33.0 (H) 10/13/2020   Lab Results  Component Value Date   TSH 3.890 10/13/2020   Lab Results  Component Value Date   CHOL 128 07/22/2021   HDL 58 07/22/2021   LDLCALC 53 07/22/2021   TRIG 89 07/22/2021   Lab Results  Component Value Date   VD25OH 40.1 07/22/2021   VD25OH 9.1 (L) 10/13/2020   Lab Results   Component Value Date   WBC 9.8 10/02/2017   HGB 11.6 (L) 10/02/2017   HCT 34.8 (L) 10/02/2017   MCV 85.1 10/02/2017   PLT 315 10/02/2017   No results found for: IRON, TIBC, FERRITIN  Obesity Behavioral Intervention:   Approximately 15 minutes were spent on the discussion below.  ASK: We discussed the diagnosis of obesity with Michelle Pearson today and Michelle Pearson agreed to give [place] permission to discuss obesity behavioral modification therapy today.  ASSESS: Michelle Pearson has the diagnosis of obesity and her BMI today is 54.8. Michelle Pearson is in the action stage of change.   ADVISE: Michelle Pearson was educated on the multiple health risks of obesity as well as the benefit of weight loss to improve her health. She was advised of the need for long term treatment and the importance of lifestyle modifications to improve her current health and to decrease her risk of future health problems.  AGREE: Multiple dietary modification options and treatment options were discussed and Michelle Pearson agreed to follow the recommendations documented in the above note.  ARRANGE: Michelle Pearson was educated on the importance of frequent visits to treat obesity as outlined per CMS and USPSTF guidelines and agreed to schedule her next follow up appointment today.  Attestation Statements:   Reviewed by clinician on day of visit: allergies, medications, problem list, medical history, surgical history, family history, social history, and previous encounter notes.  IKirke Corin, CMA, am acting as transcriptionist for Reuben Likes, MD I have reviewed the above documentation for accuracy and completeness, and I agree with the above. - Reuben Likes, MD

## 2021-11-25 DIAGNOSIS — Z124 Encounter for screening for malignant neoplasm of cervix: Secondary | ICD-10-CM | POA: Diagnosis not present

## 2021-11-25 DIAGNOSIS — Z0000 Encounter for general adult medical examination without abnormal findings: Secondary | ICD-10-CM | POA: Diagnosis not present

## 2021-11-25 DIAGNOSIS — E7800 Pure hypercholesterolemia, unspecified: Secondary | ICD-10-CM | POA: Diagnosis not present

## 2021-11-25 DIAGNOSIS — E1169 Type 2 diabetes mellitus with other specified complication: Secondary | ICD-10-CM | POA: Diagnosis not present

## 2021-11-25 DIAGNOSIS — Z1239 Encounter for other screening for malignant neoplasm of breast: Secondary | ICD-10-CM | POA: Diagnosis not present

## 2021-11-25 DIAGNOSIS — I10 Essential (primary) hypertension: Secondary | ICD-10-CM | POA: Diagnosis not present

## 2021-11-25 DIAGNOSIS — Z1331 Encounter for screening for depression: Secondary | ICD-10-CM | POA: Diagnosis not present

## 2021-11-25 DIAGNOSIS — Z1211 Encounter for screening for malignant neoplasm of colon: Secondary | ICD-10-CM | POA: Diagnosis not present

## 2021-11-30 ENCOUNTER — Encounter (INDEPENDENT_AMBULATORY_CARE_PROVIDER_SITE_OTHER): Payer: Self-pay | Admitting: Family Medicine

## 2021-11-30 ENCOUNTER — Ambulatory Visit (INDEPENDENT_AMBULATORY_CARE_PROVIDER_SITE_OTHER): Payer: Medicare Other | Admitting: Family Medicine

## 2021-11-30 VITALS — BP 127/85 | HR 96 | Temp 98.1°F | Ht 62.0 in | Wt 300.0 lb

## 2021-11-30 DIAGNOSIS — Z6841 Body Mass Index (BMI) 40.0 and over, adult: Secondary | ICD-10-CM | POA: Diagnosis not present

## 2021-11-30 DIAGNOSIS — I10 Essential (primary) hypertension: Secondary | ICD-10-CM

## 2021-11-30 DIAGNOSIS — E669 Obesity, unspecified: Secondary | ICD-10-CM

## 2021-11-30 DIAGNOSIS — E559 Vitamin D deficiency, unspecified: Secondary | ICD-10-CM | POA: Diagnosis not present

## 2021-12-09 NOTE — Progress Notes (Signed)
Chief Complaint:   OBESITY Michelle Pearson is here to discuss her progress with her obesity treatment plan along with follow-up of her obesity related diagnoses. Michelle Pearson is on the Category 4 Plan and states she is following her eating plan approximately 50% of the time. Michelle Pearson states she is walking and using resistance bands 20-25 minutes 3-4 times per week.  Today's visit was #: 14 Starting weight: 334 lbs Starting date: 10/13/2020 Today's weight: 300 lbs Today's date: 12/09/2021 Total lbs lost to date: 34 lbs Total lbs lost since last in-office visit: 0  Interim History: Michelle Pearson's birthday is coming up, but she is not sure what she will be doing.  She has not been eating as strictly on plan over last few weeks due to familial stress.  She has no upcoming travel plans.  Plans to eat off plan for her birthday.   Subjective:   1. Essential hypertension Michelle Pearson's blood pressure was well controlled today.  She denies any chest pain, chest pressure or headaches.   2. Vitamin D deficiency Michelle Pearson got labs done last week with Dr Katrinka Blazing.  She is on prescription Vitamin D.   Assessment/Plan:   1. Essential hypertension Telesia will continue current medications and there is no change in her dosing.  2. Vitamin D deficiency Hold Vitamin D refills until we see recent lab results.   3. Obesity with current BMI of 54.9 Michelle Pearson is currently in the action stage of change. As such, her goal is to continue with weight loss efforts. She has agreed to the Category 4 Plan.   Exercise goals:  As is.   Behavioral modification strategies: increasing lean protein intake, meal planning and cooking strategies, better snacking choices, and celebration eating strategies.  Michelle Pearson has agreed to follow-up with our clinic in 8 weeks. She was informed of the importance of frequent follow-up visits to maximize her success with intensive lifestyle modifications for her multiple health conditions.   Objective:   Blood  pressure 127/85, pulse 96, temperature 98.1 F (36.7 C), height 5\' 2"  (1.575 m), weight 300 lb (136.1 kg), SpO2 97 %. Body mass index is 54.87 kg/m.  General: Cooperative, alert, well developed, in no acute distress. HEENT: Conjunctivae and lids unremarkable. Cardiovascular: Regular rhythm.  Lungs: Normal work of breathing. Neurologic: No focal deficits.   Lab Results  Component Value Date   CREATININE 1.13 (H) 07/22/2021   BUN 23 07/22/2021   NA 142 07/22/2021   K 4.8 07/22/2021   CL 104 07/22/2021   CO2 22 07/22/2021   Lab Results  Component Value Date   ALT 15 07/22/2021   AST 16 07/22/2021   ALKPHOS 92 07/22/2021   BILITOT 0.3 07/22/2021   Lab Results  Component Value Date   HGBA1C 5.8 (H) 07/22/2021   Lab Results  Component Value Date   INSULIN 25.0 (H) 07/22/2021   INSULIN 33.0 (H) 10/13/2020   Lab Results  Component Value Date   TSH 3.890 10/13/2020   Lab Results  Component Value Date   CHOL 128 07/22/2021   HDL 58 07/22/2021   LDLCALC 53 07/22/2021   TRIG 89 07/22/2021   Lab Results  Component Value Date   VD25OH 40.1 07/22/2021   VD25OH 9.1 (L) 10/13/2020   Lab Results  Component Value Date   WBC 9.8 10/02/2017   HGB 11.6 (L) 10/02/2017   HCT 34.8 (L) 10/02/2017   MCV 85.1 10/02/2017   PLT 315 10/02/2017   No results found for: IRON, TIBC,  FERRITIN  Attestation Statements:   Reviewed by clinician on day of visit: allergies, medications, problem list, medical history, surgical history, family history, social history, and previous encounter notes.  I, Malcolm Metro, RMA, am acting as transcriptionist for Reuben Likes, MD.  I have reviewed the above documentation for accuracy and completeness, and I agree with the above. - Reuben Likes, MD

## 2022-01-17 IMAGING — US US RENAL
1 series · 14 of 25 positions shown · non-contrast
Comparison: Ultrasound dated 11/11/2020.

CLINICAL DATA: Right renal cyst.

EXAM:
RENAL / URINARY TRACT ULTRASOUND COMPLETE

[Series 1: us renal · 0.28mm/px · 14 of 32 slices shown]
[im 1/32]
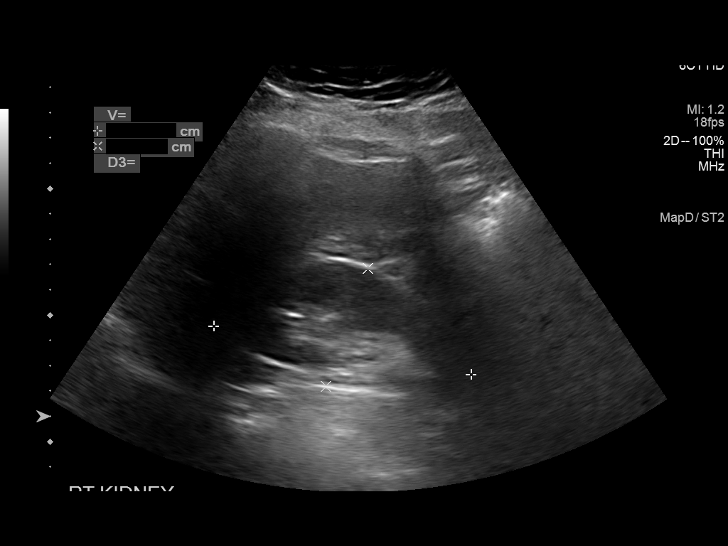
[im 3/32]
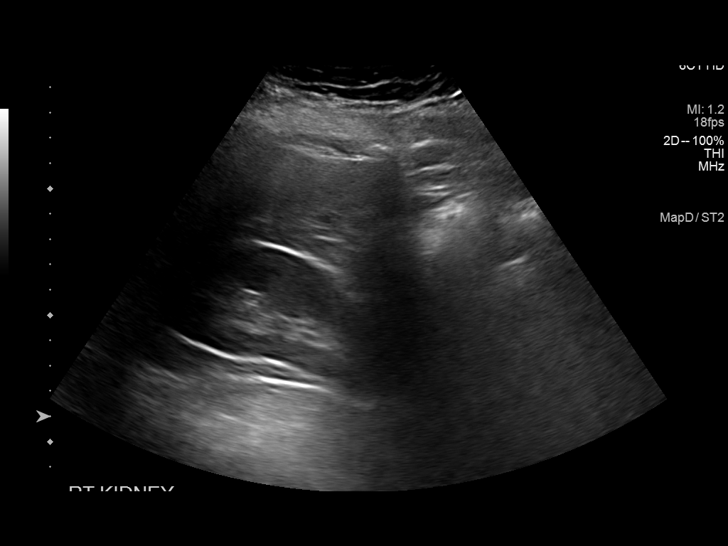
[im 6/32]
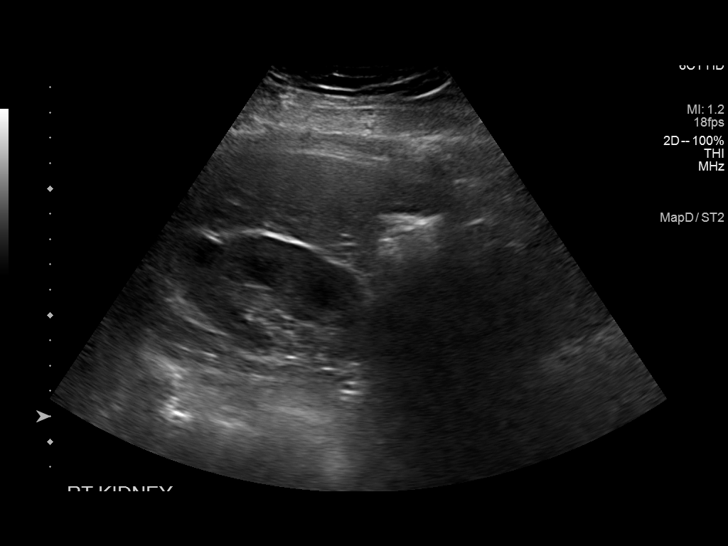
[im 8/32]
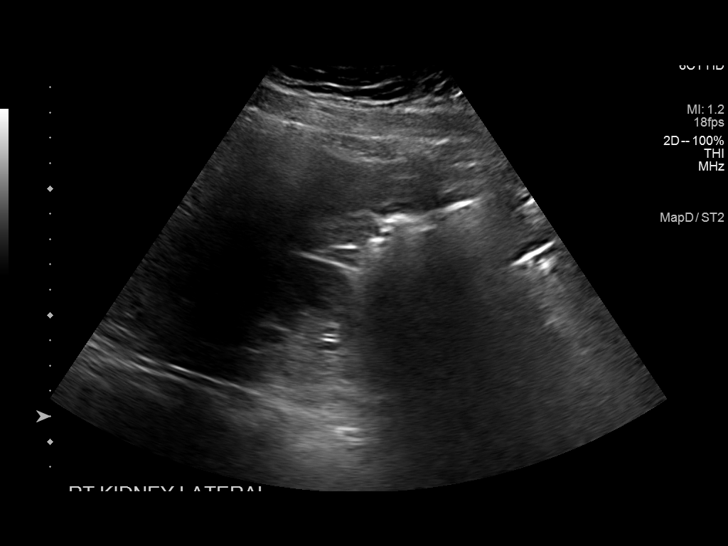
[im 11/32]
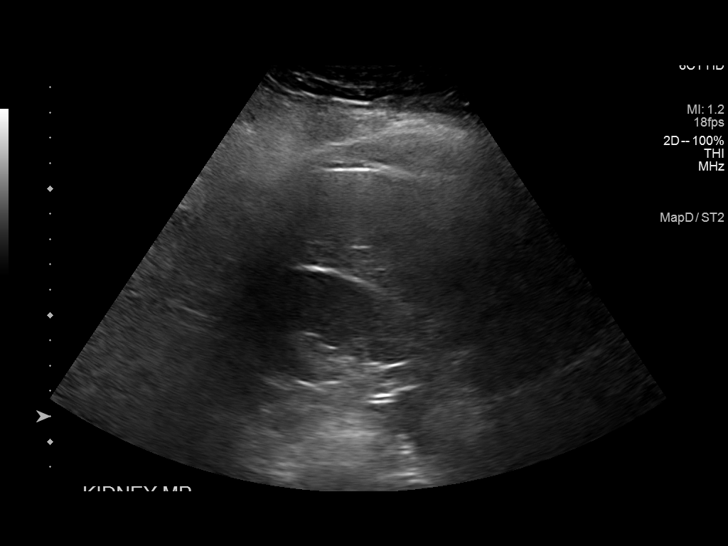
[im 12/32]
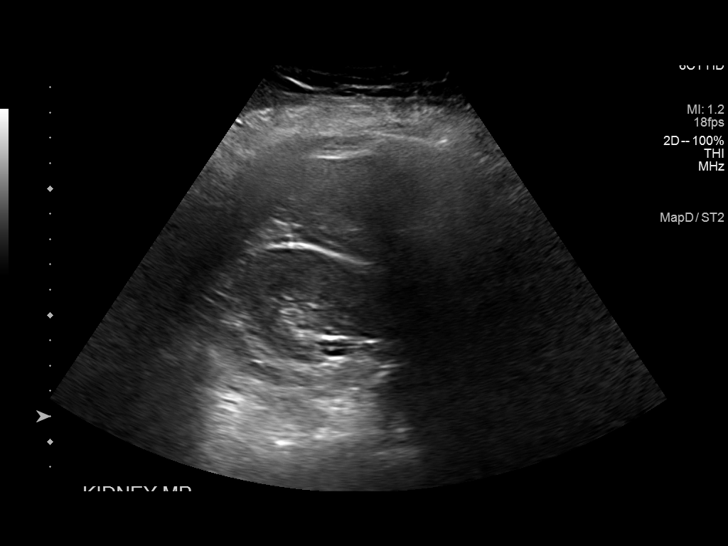
[im 15/32]
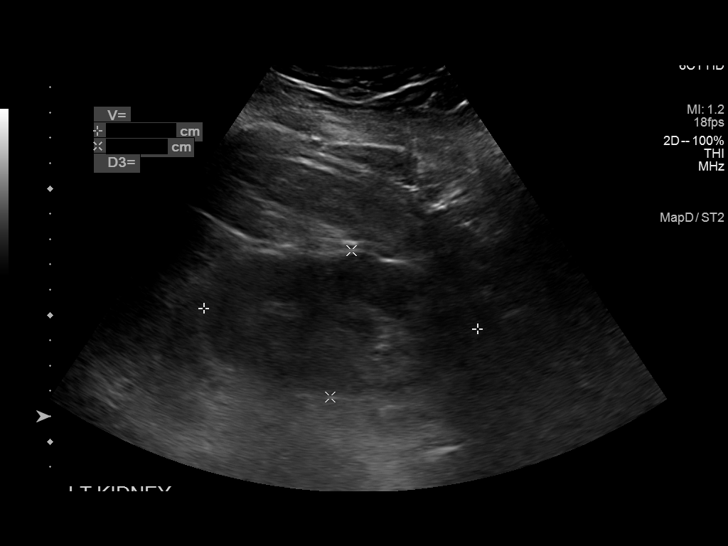
[im 17/32]
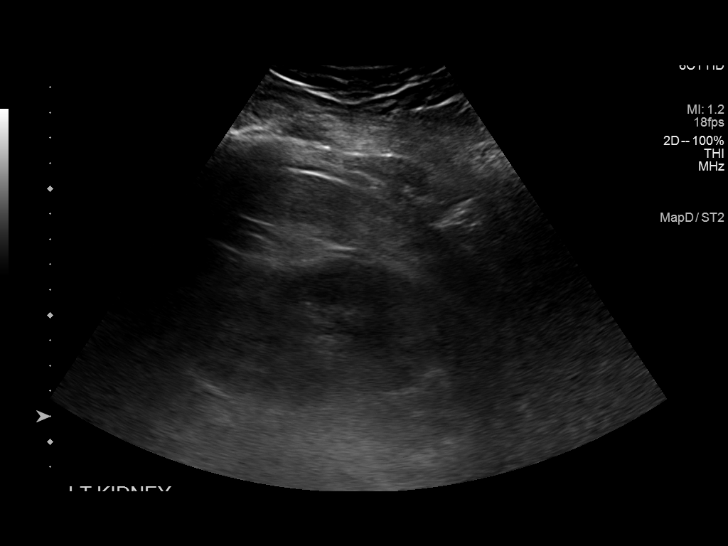
[im 20/32]
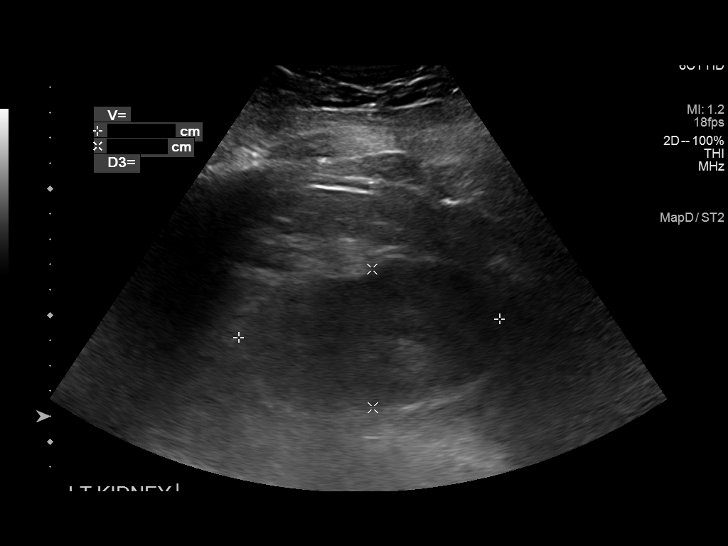
[im 21/32]
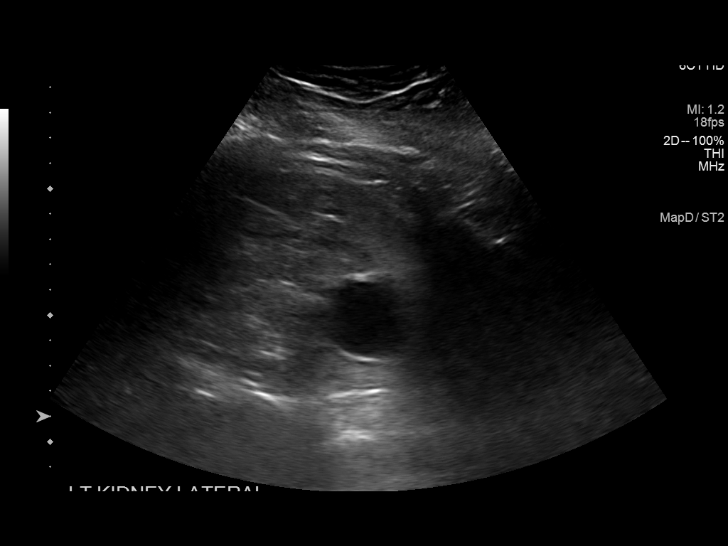
[im 24/32]
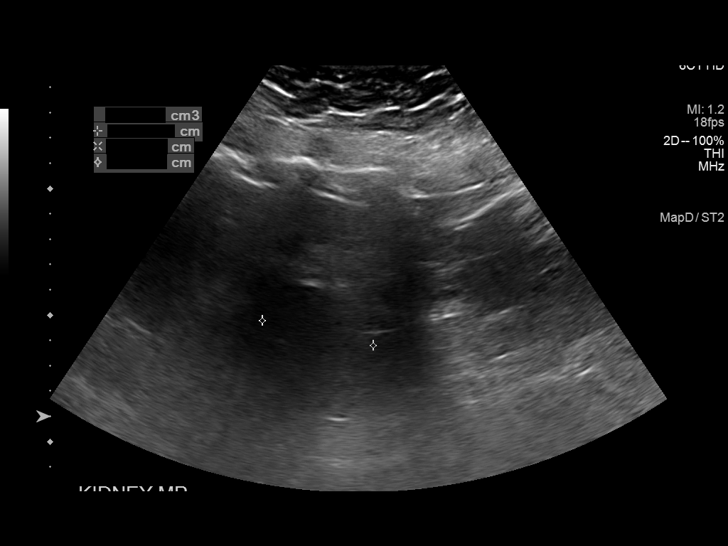
[im 26/32]
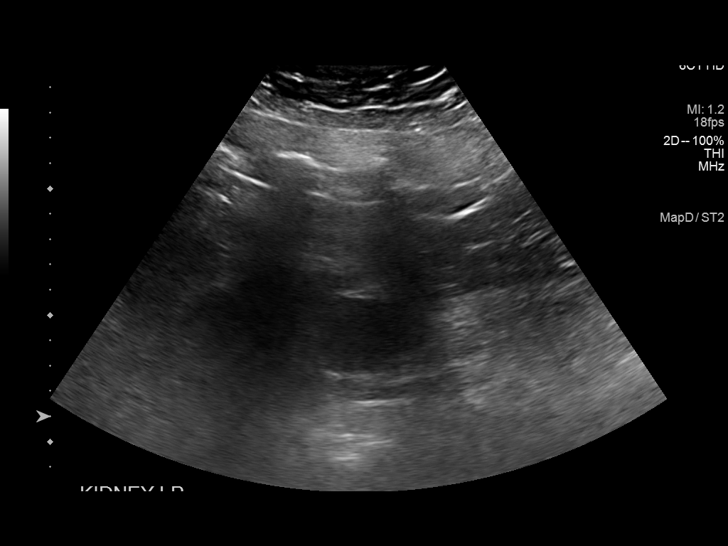
[im 29/32]
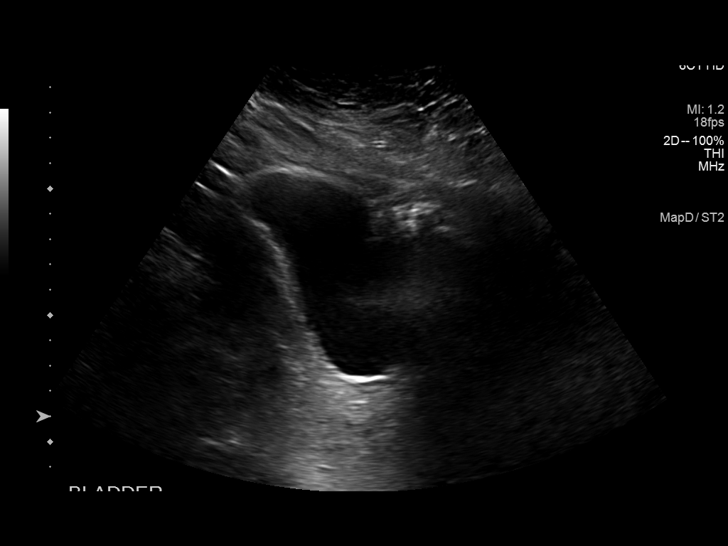
[im 32/32]
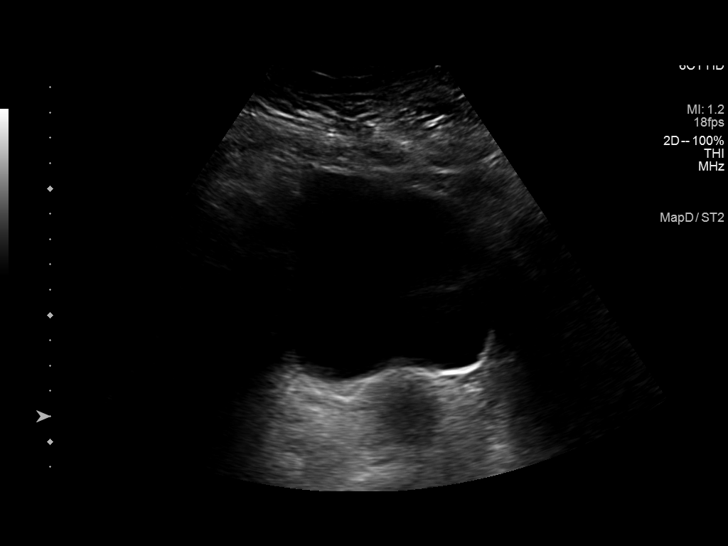

[14 of 25 positions shown; findings below may reference images not displayed]

FINDINGS: Right Kidney:

Renal measurements: 10.3 x 5.0 x 7.1 cm = volume: 190 mL. Normal
echogenicity. No hydronephrosis or shadowing stone. There is a 1.0 x
1.3 x 1.3 cm hypoechoic lesion in the upper pole of the right knee
which is suboptimally characterized but may represent a complex
cyst. This has decreased in size compared to prior ultrasound
(previously measuring 1.6 x 1.2 x 1.5 cm).

Left Kidney:

Renal measurements: 10.8 x 5.9 x 4.5 cm = volume: 133 mL. Normal
echogenicity. No hydronephrosis or shadowing stone. There is a 3.9 x
2.9 x 3.5 cm inferior pole cyst also seen on the prior ultrasound.

Bladder:

Appears normal for degree of bladder distention.

Other:

None.
IMPRESSION: 1. Small right edema of the old probable complex cysts, decreased in
size since the prior ultrasound.
2. No hydronephrosis or shadowing stone.

## 2022-01-25 ENCOUNTER — Ambulatory Visit (INDEPENDENT_AMBULATORY_CARE_PROVIDER_SITE_OTHER): Payer: Medicare Other | Admitting: Family Medicine

## 2022-02-08 ENCOUNTER — Ambulatory Visit (INDEPENDENT_AMBULATORY_CARE_PROVIDER_SITE_OTHER): Payer: Medicare Other | Admitting: Adult Health

## 2022-02-16 ENCOUNTER — Encounter (INDEPENDENT_AMBULATORY_CARE_PROVIDER_SITE_OTHER): Payer: Self-pay

## 2022-03-08 ENCOUNTER — Encounter (INDEPENDENT_AMBULATORY_CARE_PROVIDER_SITE_OTHER): Payer: Self-pay | Admitting: Family Medicine

## 2022-03-08 ENCOUNTER — Ambulatory Visit (INDEPENDENT_AMBULATORY_CARE_PROVIDER_SITE_OTHER): Payer: Medicare Other | Admitting: Family Medicine

## 2022-03-08 VITALS — BP 125/77 | HR 99 | Temp 98.2°F | Ht 62.0 in | Wt 303.0 lb

## 2022-03-08 DIAGNOSIS — Z7984 Long term (current) use of oral hypoglycemic drugs: Secondary | ICD-10-CM

## 2022-03-08 DIAGNOSIS — Z6841 Body Mass Index (BMI) 40.0 and over, adult: Secondary | ICD-10-CM

## 2022-03-08 DIAGNOSIS — E559 Vitamin D deficiency, unspecified: Secondary | ICD-10-CM

## 2022-03-08 DIAGNOSIS — E669 Obesity, unspecified: Secondary | ICD-10-CM

## 2022-03-08 DIAGNOSIS — E1165 Type 2 diabetes mellitus with hyperglycemia: Secondary | ICD-10-CM | POA: Diagnosis not present

## 2022-03-16 NOTE — Progress Notes (Signed)
Chief Complaint:   OBESITY Michelle Pearson is here to discuss her progress with her obesity treatment plan along with follow-up of her obesity related diagnoses. Michelle Pearson is on the Category 4 Plan and states she is following her eating plan approximately 50% of the time. Michelle Pearson states she is exercising 45 minutes 5 times per week.  Today's visit was #: 15 Starting weight: 334 lbs Starting date: 10/13/2020 Today's weight: 303 lbs Today's date: 03/08/2022 Total lbs lost to date: 31 lbs Total lbs lost since last in-office visit: 0  Interim History: Michelle Pearson voices that there is a lot of stuff going on at the house. Her husband is recovering from surgery and she is caring for a 56 and a 62 year old boys. She mentions that she has not been able to get nutritious food in.  Subjective:   1. Vitamin D deficiency Michelle Pearson's last Vit D level in Jan 2023 of 40.1. Denies any nausea, vomiting or muscle weakness. She notes fatigue.  2. Type 2 diabetes mellitus with hyperglycemia, without long-term current use of insulin (HCC) Michelle Pearson is currently on Metformin. Denies GI side effects. Last A1c 5.8.  Assessment/Plan:   1. Vitamin D deficiency Check Vit D level with PCP in Sept.  2. Type 2 diabetes mellitus with hyperglycemia, without long-term current use of insulin (HCC) Labs with PCP in Sept.  3. Obesity with current BMI of 55.5 Michelle Pearson is currently in the action stage of change. As such, her goal is to continue with weight loss efforts. She has agreed to the Category 4 Plan and keeping a food journal and adhering to recommended goals of 550-700 calories and 50+ grams of protein at supper.   Exercise goals: As is.  Behavioral modification strategies: increasing lean protein intake, meal planning and cooking strategies, keeping healthy foods in the home, and planning for success.  Michelle Pearson has agreed to follow-up with our clinic in 8 weeks. She was informed of the importance of frequent follow-up visits to  maximize her success with intensive lifestyle modifications for her multiple health conditions.   Objective:   Blood pressure 125/77, pulse 99, temperature 98.2 F (36.8 C), height 5\' 2"  (1.575 m), weight (!) 303 lb (137.4 kg), SpO2 99 %. Body mass index is 55.42 kg/m.  General: Cooperative, alert, well developed, in no acute distress. HEENT: Conjunctivae and lids unremarkable. Cardiovascular: Regular rhythm.  Lungs: Normal work of breathing. Neurologic: No focal deficits.   Lab Results  Component Value Date   CREATININE 1.13 (H) 07/22/2021   BUN 23 07/22/2021   NA 142 07/22/2021   K 4.8 07/22/2021   CL 104 07/22/2021   CO2 22 07/22/2021   Lab Results  Component Value Date   ALT 15 07/22/2021   AST 16 07/22/2021   ALKPHOS 92 07/22/2021   BILITOT 0.3 07/22/2021   Lab Results  Component Value Date   HGBA1C 5.8 (H) 07/22/2021   Lab Results  Component Value Date   INSULIN 25.0 (H) 07/22/2021   INSULIN 33.0 (H) 10/13/2020   Lab Results  Component Value Date   TSH 3.890 10/13/2020   Lab Results  Component Value Date   CHOL 128 07/22/2021   HDL 58 07/22/2021   LDLCALC 53 07/22/2021   TRIG 89 07/22/2021   Lab Results  Component Value Date   VD25OH 40.1 07/22/2021   VD25OH 9.1 (L) 10/13/2020   Lab Results  Component Value Date   WBC 9.8 10/02/2017   HGB 11.6 (L) 10/02/2017  HCT 34.8 (L) 10/02/2017   MCV 85.1 10/02/2017   PLT 315 10/02/2017   No results found for: "IRON", "TIBC", "FERRITIN"  Attestation Statements:   Reviewed by clinician on day of visit: allergies, medications, problem list, medical history, surgical history, family history, social history, and previous encounter notes.  I, Fortino Sic, RMA am acting as transcriptionist for Reuben Likes, MD.  I have reviewed the above documentation for accuracy and completeness, and I agree with the above. - Reuben Likes, MD

## 2022-05-08 DIAGNOSIS — B349 Viral infection, unspecified: Secondary | ICD-10-CM | POA: Diagnosis not present

## 2022-05-10 ENCOUNTER — Ambulatory Visit (INDEPENDENT_AMBULATORY_CARE_PROVIDER_SITE_OTHER): Payer: Medicare Other | Admitting: Family Medicine

## 2022-06-09 DIAGNOSIS — I10 Essential (primary) hypertension: Secondary | ICD-10-CM | POA: Diagnosis not present

## 2022-06-09 DIAGNOSIS — M25511 Pain in right shoulder: Secondary | ICD-10-CM | POA: Diagnosis not present

## 2022-06-09 DIAGNOSIS — M25512 Pain in left shoulder: Secondary | ICD-10-CM | POA: Diagnosis not present

## 2022-06-09 DIAGNOSIS — E1169 Type 2 diabetes mellitus with other specified complication: Secondary | ICD-10-CM | POA: Diagnosis not present

## 2022-06-09 DIAGNOSIS — E7800 Pure hypercholesterolemia, unspecified: Secondary | ICD-10-CM | POA: Diagnosis not present

## 2022-06-21 ENCOUNTER — Ambulatory Visit (INDEPENDENT_AMBULATORY_CARE_PROVIDER_SITE_OTHER): Payer: Medicare Other | Admitting: Family Medicine

## 2022-06-21 DIAGNOSIS — M25511 Pain in right shoulder: Secondary | ICD-10-CM | POA: Diagnosis not present

## 2022-06-21 DIAGNOSIS — M19011 Primary osteoarthritis, right shoulder: Secondary | ICD-10-CM | POA: Diagnosis not present

## 2022-06-21 DIAGNOSIS — M25512 Pain in left shoulder: Secondary | ICD-10-CM | POA: Diagnosis not present

## 2022-06-21 DIAGNOSIS — M19012 Primary osteoarthritis, left shoulder: Secondary | ICD-10-CM | POA: Diagnosis not present

## 2023-02-06 DIAGNOSIS — Z1211 Encounter for screening for malignant neoplasm of colon: Secondary | ICD-10-CM | POA: Diagnosis not present

## 2023-02-06 DIAGNOSIS — I10 Essential (primary) hypertension: Secondary | ICD-10-CM | POA: Diagnosis not present

## 2023-02-06 DIAGNOSIS — Z01419 Encounter for gynecological examination (general) (routine) without abnormal findings: Secondary | ICD-10-CM | POA: Diagnosis not present

## 2023-02-06 DIAGNOSIS — Z1331 Encounter for screening for depression: Secondary | ICD-10-CM | POA: Diagnosis not present

## 2023-02-06 DIAGNOSIS — E119 Type 2 diabetes mellitus without complications: Secondary | ICD-10-CM | POA: Diagnosis not present

## 2023-02-06 DIAGNOSIS — Z0000 Encounter for general adult medical examination without abnormal findings: Secondary | ICD-10-CM | POA: Diagnosis not present

## 2023-02-06 DIAGNOSIS — H539 Unspecified visual disturbance: Secondary | ICD-10-CM | POA: Diagnosis not present

## 2023-02-06 DIAGNOSIS — E7800 Pure hypercholesterolemia, unspecified: Secondary | ICD-10-CM | POA: Diagnosis not present

## 2023-02-06 DIAGNOSIS — Z23 Encounter for immunization: Secondary | ICD-10-CM | POA: Diagnosis not present

## 2023-02-06 DIAGNOSIS — M129 Arthropathy, unspecified: Secondary | ICD-10-CM | POA: Diagnosis not present

## 2023-04-11 DIAGNOSIS — H25813 Combined forms of age-related cataract, bilateral: Secondary | ICD-10-CM | POA: Diagnosis not present

## 2023-04-11 DIAGNOSIS — E119 Type 2 diabetes mellitus without complications: Secondary | ICD-10-CM | POA: Diagnosis not present

## 2023-05-18 ENCOUNTER — Other Ambulatory Visit (HOSPITAL_COMMUNITY)
Admission: RE | Admit: 2023-05-18 | Discharge: 2023-05-18 | Disposition: A | Discharge status: Home / Self Care | Payer: Medicare Other | Source: Ambulatory Visit | Attending: Obstetrics and Gynecology | Admitting: Obstetrics and Gynecology

## 2023-05-18 ENCOUNTER — Other Ambulatory Visit: Payer: Self-pay | Admitting: Obstetrics and Gynecology

## 2023-05-18 DIAGNOSIS — N9419 Other specified dyspareunia: Secondary | ICD-10-CM | POA: Diagnosis not present

## 2023-05-18 DIAGNOSIS — Z1151 Encounter for screening for human papillomavirus (HPV): Secondary | ICD-10-CM | POA: Diagnosis not present

## 2023-05-18 DIAGNOSIS — Z01419 Encounter for gynecological examination (general) (routine) without abnormal findings: Secondary | ICD-10-CM | POA: Diagnosis not present

## 2023-05-18 DIAGNOSIS — N952 Postmenopausal atrophic vaginitis: Secondary | ICD-10-CM | POA: Diagnosis not present

## 2023-05-22 LAB — CYTOLOGY - PAP
Adequacy: ABSENT
Comment: NEGATIVE
Diagnosis: NEGATIVE
High risk HPV: NEGATIVE

## 2024-04-15 DIAGNOSIS — N1831 Chronic kidney disease, stage 3a: Secondary | ICD-10-CM | POA: Diagnosis not present

## 2024-04-15 DIAGNOSIS — Z1211 Encounter for screening for malignant neoplasm of colon: Secondary | ICD-10-CM | POA: Diagnosis not present

## 2024-04-15 DIAGNOSIS — Z1331 Encounter for screening for depression: Secondary | ICD-10-CM | POA: Diagnosis not present

## 2024-04-15 DIAGNOSIS — Z0000 Encounter for general adult medical examination without abnormal findings: Secondary | ICD-10-CM | POA: Diagnosis not present

## 2024-04-15 DIAGNOSIS — E7800 Pure hypercholesterolemia, unspecified: Secondary | ICD-10-CM | POA: Diagnosis not present

## 2024-04-15 DIAGNOSIS — M1990 Unspecified osteoarthritis, unspecified site: Secondary | ICD-10-CM | POA: Diagnosis not present

## 2024-04-15 DIAGNOSIS — I10 Essential (primary) hypertension: Secondary | ICD-10-CM | POA: Diagnosis not present

## 2024-04-15 DIAGNOSIS — E1169 Type 2 diabetes mellitus with other specified complication: Secondary | ICD-10-CM | POA: Diagnosis not present
